# Patient Record
Sex: Male | Born: 1990 | Race: White | Hispanic: No | Marital: Married | Smoking: Current every day smoker
Health system: Southern US, Community
[De-identification: ages and names within clinical notes are randomized; demographics above are authoritative.]

## PROBLEM LIST (undated history)

## (undated) DIAGNOSIS — F32A Depression, unspecified: Secondary | ICD-10-CM

## (undated) DIAGNOSIS — F329 Major depressive disorder, single episode, unspecified: Secondary | ICD-10-CM

## (undated) HISTORY — PX: TONSILLECTOMY: SUR1361

## (undated) HISTORY — PX: ANKLE RECONSTRUCTION: SHX1151

---

## 2015-04-09 ENCOUNTER — Emergency Department
Admission: EM | Admit: 2015-04-09 | Discharge: 2015-04-09 | Disposition: A | Discharge status: Home / Self Care | Payer: Self-pay

## 2015-05-25 ENCOUNTER — Encounter: Payer: Self-pay | Admitting: Emergency Medicine

## 2015-05-25 ENCOUNTER — Emergency Department

## 2015-05-25 ENCOUNTER — Emergency Department
Admission: EM | Admit: 2015-05-25 | Discharge: 2015-05-25 | Disposition: A | Discharge status: Home / Self Care | Attending: Emergency Medicine | Admitting: Emergency Medicine

## 2015-05-25 DIAGNOSIS — S43004A Unspecified dislocation of right shoulder joint, initial encounter: Secondary | ICD-10-CM

## 2015-05-25 DIAGNOSIS — S4991XA Unspecified injury of right shoulder and upper arm, initial encounter: Secondary | ICD-10-CM | POA: Diagnosis present

## 2015-05-25 DIAGNOSIS — Y9389 Activity, other specified: Secondary | ICD-10-CM | POA: Insufficient documentation

## 2015-05-25 DIAGNOSIS — X58XXXA Exposure to other specified factors, initial encounter: Secondary | ICD-10-CM | POA: Insufficient documentation

## 2015-05-25 DIAGNOSIS — Y998 Other external cause status: Secondary | ICD-10-CM | POA: Insufficient documentation

## 2015-05-25 DIAGNOSIS — M24412 Recurrent dislocation, left shoulder: Secondary | ICD-10-CM | POA: Insufficient documentation

## 2015-05-25 DIAGNOSIS — Z880 Allergy status to penicillin: Secondary | ICD-10-CM | POA: Diagnosis not present

## 2015-05-25 DIAGNOSIS — S43014A Anterior dislocation of right humerus, initial encounter: Secondary | ICD-10-CM | POA: Insufficient documentation

## 2015-05-25 DIAGNOSIS — Y9289 Other specified places as the place of occurrence of the external cause: Secondary | ICD-10-CM | POA: Insufficient documentation

## 2015-05-25 DIAGNOSIS — M21821 Other specified acquired deformities of right upper arm: Secondary | ICD-10-CM

## 2015-05-25 HISTORY — DX: Major depressive disorder, single episode, unspecified: F32.9

## 2015-05-25 HISTORY — DX: Depression, unspecified: F32.A

## 2015-05-25 MED ORDER — KETAMINE HCL 50 MG/ML IJ SOLN
INTRAMUSCULAR | Status: AC
  Administered 2015-05-25: 150 mg
  Filled 2015-05-25: qty 10

## 2015-05-25 MED ORDER — KETAMINE HCL 10 MG/ML IJ SOLN
INTRAMUSCULAR | Status: AC | PRN
  Administered 2015-05-25: 150 mg via INTRAVENOUS

## 2015-05-25 MED ORDER — ORPHENADRINE CITRATE 30 MG/ML IJ SOLN
60.0000 mg | Freq: Two times a day (BID) | INTRAMUSCULAR | Status: DC
Start: 2015-05-25 — End: 2015-05-25
  Administered 2015-05-25: 60 mg via INTRAMUSCULAR
  Filled 2015-05-25 (×3): qty 2

## 2015-05-25 MED ORDER — SODIUM CHLORIDE 0.9 % IV SOLN
INTRAVENOUS | Status: AC | PRN
  Administered 2015-05-25: 1000 mL via INTRAVENOUS

## 2015-05-25 MED ORDER — KETOROLAC TROMETHAMINE 60 MG/2ML IM SOLN
60.0000 mg | Freq: Once | INTRAMUSCULAR | Status: AC
  Administered 2015-05-25: 60 mg via INTRAMUSCULAR
  Filled 2015-05-25: qty 2

## 2015-05-25 MED ORDER — KETOROLAC TROMETHAMINE 60 MG/2ML IM SOLN
INTRAMUSCULAR | Status: AC
  Administered 2015-05-25: 60 mg via INTRAMUSCULAR
  Filled 2015-05-25: qty 2

## 2015-05-25 MED ORDER — ORPHENADRINE CITRATE 30 MG/ML IJ SOLN
INTRAMUSCULAR | Status: AC
  Administered 2015-05-25: 60 mg via INTRAMUSCULAR
  Filled 2015-05-25: qty 2

## 2015-05-25 MED ORDER — KETAMINE HCL 10 MG/ML IJ SOLN: 1.0000 mg/kg | Freq: Once | INTRAMUSCULAR | Status: DC

## 2015-05-25 NOTE — Sedation Documentation (Signed)
Shoulder immobilizer applied .

## 2015-05-25 NOTE — ED Notes (Signed)
X-ray at bedside

## 2015-05-25 NOTE — ED Provider Notes (Addendum)
[facility] Emergency Department Provider Note  ____________________________________________  Time seen: Approximately 845 AM  I have reviewed the triage vital signs and the nursing notes.   HISTORY  Chief Complaint Shoulder Pain    HPI Roger Ponce is a 24 y.o. male with a history of multiple right shoulder dislocations who is presenting today with right shoulder pain. He said that he woke up this morning at about 6:15 with right shoulder pain. He says it feels similar to what it has been dislocated in the past. He says that he did not have any trauma to dislocate the shoulder. Did not move his shoulder in an awkward way dislocated. He denies any loss of sensation to the right upper extremity. Is able to range his right hand. Says he has needed to be sedated in the past to have the shoulder reduced. He does not have an orthopedic doctor. He is waiting for his [employer] insurance to become active in order for him to have surgery on the right shoulder. His last time that he had a dislocated was this past March. He last ate at 9 PM last night. Patient is right-hand dominant. Request that his wife was at the bedside signed his consent for procedural sedation and reduction.   Past Medical History  Diagnosis Date  . Depression     There are no active problems to display for this patient.   History reviewed. No pertinent past surgical history.  Current Outpatient Rx  Name  Route  Sig  Dispense  Refill  . hydrOXYzine (ATARAX/VISTARIL) 50 MG tablet   Oral   Take 50 mg by mouth 3 (three) times daily as needed.           Allergies Morphine and related; Amoxicillin; and Penicillins  No family history on file.  Social History Social History  Substance Use Topics  . Smoking status: Never Smoker   . Smokeless tobacco: None  . Alcohol Use: No    Review of Systems Constitutional: No fever/chills Eyes: No visual changes. ENT: No sore  throat. Cardiovascular: Denies chest pain. Respiratory: Denies shortness of breath. Gastrointestinal: No abdominal pain.  No nausea, no vomiting.  No diarrhea.  No constipation. Genitourinary: Negative for dysuria. Musculoskeletal: Negative for back pain. Skin: Negative for rash. Neurological: Negative for headaches, focal weakness or numbness.  10-point ROS otherwise negative.  ____________________________________________   PHYSICAL EXAM:  VITAL SIGNS: ED Triage Vitals  Enc Vitals Group     BP 05/25/15 0716 153/75 mmHg     Pulse Rate 05/25/15 0716 58     Resp 05/25/15 0716 20     Temp 05/25/15 0716 97.7 F (36.5 C)     Temp Source 05/25/15 0716 Oral     SpO2 05/25/15 0716 97 %     Weight 05/25/15 0716 225 lb (102.059 kg)     Height 05/25/15 0716  (1.88 m)     Head Cir --      Peak Flow --      Pain Score --      Pain Loc --      Pain Edu? --      Excl. in GC? --     Constitutional: Alert and oriented. Well appearing and in no acute distress. Eyes: Conjunctivae are normal. PERRL. EOMI. Head: Atraumatic. Nose: No congestion/rhinnorhea. Mouth/Throat: Mucous membranes are moist.  Oropharynx non-erythematous. Mallampati of 1.   Neck: No stridor.   Cardiovascular: Normal rate, regular rhythm. Grossly normal heart sounds.  Good peripheral circulation. Respiratory: Normal respiratory effort.  No retractions. Lungs CTAB. Gastrointestinal: Soft and nontender. No distention. No abdominal bruits. No CVA tenderness. Musculoskeletal: No lower extremity tenderness nor edema.  Right shoulder with anterior and lateral tenderness palpation. Humerus is displaced anteriorly on exam. There is sensation over the deltoid. Distally neurovascularly intact. 5 out of 5 strength to the right hand.  Neurologic:  Normal speech and language. No gross focal neurologic deficits are appreciated. No gait instability. Skin:  Skin is warm, dry and intact. No rash noted. Psychiatric: Mood and affect  are normal. Speech and behavior are normal.  ____________________________________________   LABS (all labs ordered are listed, but only abnormal results are displayed)  Labs Reviewed - No data to display ____________________________________________  EKG   ____________________________________________  RADIOLOGY  Initial x-ray showing anterior dislocation with Hill-Sachs deformity. Postreduction film showing successful reduction with inability to rule out Hill-Sachs deformity. I personally reviewed both these films. ____________________________________________   PROCEDURES  Reduction of dislocation Date/Time: 9AM Performed by: Arelia Longest Authorized by: Arelia Longest Consent: Verbal and written consent obtained. Risks and benefits: risks, benefits and alternatives were discussed Consent given by: patient Required items: required blood products, implants, devices, and special equipment available Time out: Immediately prior to procedure a "time out" was called to verify the correct patient, procedure, equipment, support staff and site/side marked as required.  Patient sedated: Sedated with ketamine at 1.5 mg/kg.  End tidal CO2 monitored. Patient tolerated the procedure well with the sedation.  Vitals: Vital signs were monitored during sedation. Patient tolerance: Patient tolerated the procedure well with no immediate complications. Joint: Right shoulder Reduction technique: Modified [place].   Reduction successful and placed in right shoulder immobilizer status post reduction.    ____________________________________________   INITIAL IMPRESSION / ASSESSMENT AND PLAN / ED COURSE  Pertinent labs & imaging results that were available during my care of the patient were reviewed by me and considered in my medical decision making (see chart for details).  ----------------------------------------- 10:12 AM on  05/25/2015 -----------------------------------------  Patient is now back to baseline. 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 Will follow-up with on-call orthopedics. Comfortable in shoulder immobilizer and still neurovascularly intact with sensation over the right deltoid. ____________________________________________   FINAL CLINICAL IMPRESSION(S) / ED DIAGNOSES  Acute right shoulder dislocation with reduction. Possible Hill-Sachs deformity. Procedural sedation. Initial visit.     Myrna Blazer, MD 05/25/15 1014  Patient knows that he must wear the shoulder immobilizer until he is followed up by orthopedics.  Myrna Blazer, MD 05/25/15 [phone]

## 2015-05-25 NOTE — ED Notes (Signed)
States he has a hx of shoulder dislocation ..woke up this am with right shoulder pain  Positive deformity

## 2015-05-25 NOTE — ED Notes (Signed)
Pt able to state name, location, situation, and time, wife at bedsdie

## 2015-05-25 NOTE — ED Notes (Signed)
Pt states he woke up this AM with right shoulder pain and dislocation, pt states this has happened 3 times before, pt states he is to get surgery at the [facility] whenever his benfits kick in, swelling noted to the right shoulder area

## 2015-05-25 NOTE — ED Notes (Signed)
Pt moved to room 25 via stretcher . Report called to [facility]

## 2015-05-25 NOTE — Sedation Documentation (Signed)
Medication dose calculated and verified for by [facility]

## 2015-05-25 NOTE — Discharge Instructions (Signed)
Shoulder Dislocation ° Shoulder dislocation is when your upper arm bone (humerus) is forced out of your shoulder joint. Your doctor will put your shoulder back into the joint by pulling on your arm or through surgery. Your arm will be placed in a shoulder immobilizer or sling. The shoulder immobilizer or sling holds your shoulder in place while it heals. °HOME CARE  °· Rest your injured joint. Do not move it until instructed to do so. °· Put ice on your injured joint as told by your doctor. °¨ Put ice in a plastic bag. °¨ Place a towel between your skin and the bag. °¨ Leave the ice on for 15-20 minutes at a time, every 2 hours while you are awake. °· Only take medicines as told by your doctor. °· Squeeze a ball to exercise your hand. °GET HELP RIGHT AWAY IF:  °· Your splint or sling becomes damaged. °· Your pain becomes worse, not better. °· You lose feeling in your arm or hand. °· Your arm or hand becomes white or cold. °MAKE SURE YOU:  °· Understand these instructions. °· Will watch your condition. °· Will get help right away if you are not doing well or get worse. °Document Released: 11/27/2011 Document Reviewed: 11/27/2011 °Exit[employer]® Patient Information ©2015 [employer]. This information is not intended to replace advice given to you by your health care provider. Make sure you discuss any questions you have with your health care provider. ° °

## 2016-01-19 ENCOUNTER — Emergency Department: Payer: BLUE CROSS/BLUE SHIELD

## 2016-01-19 ENCOUNTER — Emergency Department
Admission: EM | Admit: 2016-01-19 | Discharge: 2016-01-19 | Disposition: A | Discharge status: Home / Self Care | Payer: BLUE CROSS/BLUE SHIELD | Attending: Emergency Medicine | Admitting: Emergency Medicine

## 2016-01-19 ENCOUNTER — Encounter: Payer: Self-pay | Admitting: Emergency Medicine

## 2016-01-19 DIAGNOSIS — Y9384 Activity, sleeping: Secondary | ICD-10-CM | POA: Insufficient documentation

## 2016-01-19 DIAGNOSIS — S4991XA Unspecified injury of right shoulder and upper arm, initial encounter: Secondary | ICD-10-CM | POA: Diagnosis present

## 2016-01-19 DIAGNOSIS — Y999 Unspecified external cause status: Secondary | ICD-10-CM | POA: Insufficient documentation

## 2016-01-19 DIAGNOSIS — Y929 Unspecified place or not applicable: Secondary | ICD-10-CM | POA: Diagnosis not present

## 2016-01-19 DIAGNOSIS — S43004A Unspecified dislocation of right shoulder joint, initial encounter: Secondary | ICD-10-CM | POA: Insufficient documentation

## 2016-01-19 DIAGNOSIS — F329 Major depressive disorder, single episode, unspecified: Secondary | ICD-10-CM | POA: Diagnosis not present

## 2016-01-19 DIAGNOSIS — X509XXA Other and unspecified overexertion or strenuous movements or postures, initial encounter: Secondary | ICD-10-CM | POA: Insufficient documentation

## 2016-01-19 DIAGNOSIS — Z880 Allergy status to penicillin: Secondary | ICD-10-CM | POA: Insufficient documentation

## 2016-01-19 MED ORDER — HYDROMORPHONE HCL 1 MG/ML IJ SOLN
1.0000 mg | Freq: Once | INTRAMUSCULAR | Status: AC
  Administered 2016-01-19: 1 mg via INTRAVENOUS
  Filled 2016-01-19: qty 1

## 2016-01-19 MED ORDER — ETOMIDATE 2 MG/ML IV SOLN
10.0000 mg | Freq: Once | INTRAVENOUS | Status: DC
  Filled 2016-01-19: qty 10

## 2016-01-19 MED ORDER — ETOMIDATE 2 MG/ML IV SOLN
INTRAVENOUS | Status: AC | PRN
  Administered 2016-01-19 (×2): 5 mg via INTRAVENOUS
  Administered 2016-01-19: 10 mg via INTRAVENOUS

## 2016-01-19 MED ORDER — SODIUM CHLORIDE 0.9 % IV SOLN
INTRAVENOUS | Status: AC | PRN
  Administered 2016-01-19: 1000 mL via INTRAVENOUS

## 2016-01-19 MED ORDER — OXYCODONE-ACETAMINOPHEN 5-325 MG PO TABS: 1.0000 | ORAL_TABLET | Freq: Four times a day (QID) | ORAL | Status: DC | PRN

## 2016-01-19 NOTE — Discharge Instructions (Signed)
Shoulder Dislocation °A shoulder dislocation happens when the upper arm bone (humerus) moves out of the shoulder joint. The shoulder joint is the part of the shoulder where the humerus, shoulder blade (scapula), and collarbone (clavicle) meet. °CAUSES °This condition is often caused by: °· A fall. °· A hit to the shoulder. °· A forceful movement of the shoulder. °RISK FACTORS °This condition is more likely to develop in people who play sports. °SYMPTOMS °Symptoms of this condition include: °· Deformity of the shoulder. °· Intense pain. °· Inability to move the shoulder. °· Numbness, weakness, or tingling in your neck or down your arm. °· Bruising or swelling around your shoulder. °DIAGNOSIS °This condition is diagnosed with a physical exam. After the exam, tests may be done to check for related problems. Tests that may be done include: °· X-ray. This may be done to check for broken bones. °· MRI. This may be done to check for damage to the tissues around the shoulder. °· Electromyogram. This may be done to check for nerve damage. °TREATMENT °This condition is treated with a procedure to place the humerus back in the joint. This procedure is called a reduction. There are two types of reduction: °· Closed reduction. In this procedure, the humerus is placed back in the joint without surgery. The health care provider uses his or her hands to guide the bone back into place. °· Open reduction. In this procedure, the humerus is placed back in the joint with surgery. An open reduction may be recommended if: °¨ You have a weak shoulder joint or weak ligaments. °¨ You have had more than one shoulder dislocation. °¨ The nerves or blood vessels around your shoulder have been damaged. °After the humerus is placed back into the joint, your arm will be placed in a splint or sling to prevent it from moving. You will need to wear the splint or sling until your shoulder heals. When the splint or sling is removed, you may have  physical therapy to help improve the range of motion in your shoulder joint. °HOME CARE INSTRUCTIONS °If You Have a Splint or Sling: °· Wear it as told by your health care provider. Remove it only as told by your health care provider. °· Loosen it if your fingers become numb and tingle, or if they turn cold and blue. °· Keep it clean and dry. °Bathing °· Do not take baths, swim, or use a hot tub until your health care provider approves. Ask your health care provider if you can take showers. You may only be allowed to take sponge baths for bathing. °· If your health care provider approves bathing and showering, cover your splint or sling with a watertight plastic bag to protect it from water. Do not let the splint or sling get wet. °Managing Pain, Stiffness, and Swelling °· If directed, apply ice to the injured area. °¨ Put ice in a plastic bag. °¨ Place a towel between your skin and the bag. °¨ Leave the ice on for 20 minutes, 2-3 times per day. °· Move your fingers often to avoid stiffness and to decrease swelling. °· Raise (elevate) the injured area above the level of your heart while you are sitting or lying down. °Driving °· Do not drive while wearing a splint or sling on a hand that you use for driving. °· Do not drive or operate heavy machinery while taking pain medicine. °Activity °· Return to your normal activities as told by your health care provider. Ask your   health care provider what activities are safe for you. °· Perform range-of-motion exercises only as told by your health care provider. °· Exercise your hand by squeezing a soft ball. This helps to decrease stiffness and swelling in your hand and wrist. °General Instructions °· Take over-the-counter and prescription medicines only as told by your health care provider. °· Do not use any tobacco products, including cigarettes, chewing tobacco, or e-cigarettes. Tobacco can delay bone and tissue healing. If you need help quitting, ask your health care  provider. °· Keep all follow-up visits as told by your health care provider. This is important. °SEEK MEDICAL CARE IF: °· Your splint or sling gets damaged. °SEEK IMMEDIATE MEDICAL CARE IF: °· Your pain gets worse rather than better. °· You lose feeling in your arm or hand. °· Your arm or hand becomes white and cold. °  °This information is not intended to replace advice given to you by your health care provider. Make sure you discuss any questions you have with your health care provider. °  °Document Released: 05/30/2001 Document Revised: 05/26/2015 Document Reviewed: 12/28/2014 °Elsevier Interactive Patient Education ©2016 [employer] ° °

## 2016-01-19 NOTE — ED Notes (Signed)
Pt presents to ED via EMS with dislocated right shoulder. Pt states he dislocated it rolling while sleeping. Deformity noted at right shoulder. HX of dislocating shoulders.

## 2016-01-19 NOTE — Sedation Documentation (Signed)
Reduction completed by Dr. Zenda AlpersWebster.

## 2016-01-19 NOTE — ED Provider Notes (Signed)
[facility] Emergency Department Provider Note   ____________________________________________  Time seen: Approximately 4:03 AM  I have reviewed the triage vital signs and the nursing notes.   HISTORY  Chief Complaint Dislocation    HPI Roger Ponce is a 25 y.o. male who comes into the hospital today with a shoulder dislocation. The patient reports that he was sleeping and dislocated shoulder. He reports that he is on it before on the right arm. He is dislocated his shoulder 4 times this year. He rates his pain 8 out of 10 in intensity. He reports that he always has been put out to get his shoulder back in. He has no numbness or tingling at this time.   Past Medical History  Diagnosis Date  . Depression     There are no active problems to display for this patient.   Past Surgical History  Procedure Laterality Date  . Ankle reconstruction    . Tonsillectomy      Current Outpatient Rx  Name  Route  Sig  Dispense  Refill  . hydrOXYzine (ATARAX/VISTARIL) 50 MG tablet   Oral   Take 50 mg by mouth 3 (three) times daily as needed.         Marland Kitchen oxyCODONE-acetaminophen (ROXICET) 5-325 MG tablet   Oral   Take 1 tablet by mouth every 6 (six) hours as needed.   12 tablet   0     Allergies Morphine and related; Amoxicillin; and Penicillins  History reviewed. No pertinent family history.  Social History Social History  Substance Use Topics  . Smoking status: Never Smoker   . Smokeless tobacco: None  . Alcohol Use: No    Review of Systems Constitutional: No fever/chills Eyes: No visual changes. ENT: No sore throat. Cardiovascular: Denies chest pain. Respiratory: Denies shortness of breath. Gastrointestinal: No abdominal pain.  No nausea, no vomiting.  No diarrhea.  No constipation. Genitourinary: Negative for dysuria. Musculoskeletal: right shoulder pain and dislocation Skin: Negative for rash. Neurological: Negative for  headaches, focal weakness or numbness.  10-point ROS otherwise negative.  ____________________________________________   PHYSICAL EXAM:  VITAL SIGNS: ED Triage Vitals  Enc Vitals Group     BP 01/19/16 0356 132/76 mmHg     Pulse Rate 01/19/16 0356 61     Resp 01/19/16 0356 16     Temp 01/19/16 0356 98.1 F (36.7 C)     Temp Source 01/19/16 0356 Oral     SpO2 01/19/16 0356 97 %     Weight --      Height --      Head Cir --      Peak Flow --      Pain Score 01/19/16 0356 8     Pain Loc --      Pain Edu? --      Excl. in GC? --     Constitutional: Alert and oriented. Well appearing and in moderate distress. Eyes: Conjunctivae are normal. PERRL. EOMI. Head: Atraumatic. Nose: No congestion/rhinnorhea. Mouth/Throat: Mucous membranes are moist.  Oropharynx non-erythematous. Cardiovascular: Normal rate, regular rhythm. Grossly normal heart sounds.  Good peripheral circulation. Respiratory: Normal respiratory effort.  No retractions. Lungs CTAB. Gastrointestinal: Soft and nontender. No distention. Positive bowel sounds Musculoskeletal: right shoulder deformity with pain and inability to move   Neurologic:  Normal speech and language.  Skin:  Skin is warm, dry and intact.  Psychiatric: Mood and affect are normal.   ____________________________________________   LABS (all labs ordered are  listed, but only abnormal results are displayed)  Labs Reviewed - No data to display ____________________________________________  EKG  none ____________________________________________  RADIOLOGY  Right shoulder Xray: Anterior dislocation of the right shoulder.  Right shoulder xray: Interval relocation of the right shoulder ____________________________________________   PROCEDURES  Procedure(s) performed: please, see procedure note(s).   Reduction of dislocation Date/Time: 5:07 AM Performed by: Lucrezia EuropeWebster,  Allison P Authorized by: Lucrezia EuropeWebster,  Allison P Consent: Verbal consent  obtained. Risks and benefits: risks, benefits and alternatives were discussed Consent given by: patient Required items: required blood products, implants, devices, and special equipment available Time out: Immediately prior to procedure a "time out" was called to verify the correct patient, procedure, equipment, support staff and site/side marked as required.  Patient sedated: with etomidate  Vitals: Vital signs were monitored during sedation. Patient tolerance: Patient tolerated the procedure well with no immediate complications. Joint: right shoulder Reduction technique: traction and rotation   Critical Care performed: No  ____________________________________________   INITIAL IMPRESSION / ASSESSMENT AND PLAN / ED COURSE  Pertinent labs & imaging results that were available during my care of the patient were reviewed by me and considered in my medical decision making (see chart for details).  Is a 25 year old male who comes into the hospital today with a shoulder dislocation. The patient was sedated with etomidate and his shoulder was reduced. He did receive a dose of Dilaudid for pain as well. He was placed in an immobilizer and he'll be discharged home to follow-up with orthopedic surgery. ____________________________________________   FINAL CLINICAL IMPRESSION(S) / ED DIAGNOSES  Final diagnoses:  Shoulder dislocation, right, initial encounter      NEW MEDICATIONS STARTED DURING THIS VISIT:  Discharge Medication List as of 01/19/2016  6:08 AM    START taking these medications   Details  oxyCODONE-acetaminophen (ROXICET) 5-325 MG tablet Take 1 tablet by mouth every 6 (six) hours as needed., Starting 01/19/2016, Until Discontinued, Print         Note:  This document was prepared using Dragon voice recognition software and may include unintentional dictation errors.    Rebecka ApleyAllison P Webster, MD 01/19/16 [phone]

## 2016-01-19 NOTE — ED Notes (Signed)
Blood collected and send

## 2016-01-19 NOTE — ED Notes (Signed)
Patient transported to X-ray 

## 2016-12-13 ENCOUNTER — Emergency Department
Admission: EM | Admit: 2016-12-13 | Discharge: 2016-12-13 | Disposition: A | Discharge status: Home / Self Care | Payer: BLUE CROSS/BLUE SHIELD | Attending: Emergency Medicine | Admitting: Emergency Medicine

## 2016-12-13 ENCOUNTER — Emergency Department: Payer: BLUE CROSS/BLUE SHIELD

## 2016-12-13 DIAGNOSIS — Y939 Activity, unspecified: Secondary | ICD-10-CM | POA: Insufficient documentation

## 2016-12-13 DIAGNOSIS — Y929 Unspecified place or not applicable: Secondary | ICD-10-CM | POA: Insufficient documentation

## 2016-12-13 DIAGNOSIS — S43004A Unspecified dislocation of right shoulder joint, initial encounter: Secondary | ICD-10-CM

## 2016-12-13 DIAGNOSIS — X58XXXA Exposure to other specified factors, initial encounter: Secondary | ICD-10-CM | POA: Insufficient documentation

## 2016-12-13 DIAGNOSIS — Y999 Unspecified external cause status: Secondary | ICD-10-CM | POA: Insufficient documentation

## 2016-12-13 DIAGNOSIS — S43014A Anterior dislocation of right humerus, initial encounter: Secondary | ICD-10-CM | POA: Insufficient documentation

## 2016-12-13 MED ORDER — KETOROLAC TROMETHAMINE 30 MG/ML IJ SOLN
30.0000 mg | Freq: Once | INTRAMUSCULAR | Status: AC
  Administered 2016-12-13: 30 mg via INTRAVENOUS
  Filled 2016-12-13: qty 1

## 2016-12-13 MED ORDER — PROPOFOL 10 MG/ML IV BOLUS
INTRAVENOUS | Status: AC | PRN
  Administered 2016-12-13: 102.3 mg via INTRAVENOUS

## 2016-12-13 MED ORDER — SODIUM CHLORIDE 0.9 % IV SOLN
INTRAVENOUS | Status: AC | PRN
  Administered 2016-12-13: 1000 mL via INTRAVENOUS

## 2016-12-13 MED ORDER — PROPOFOL 1000 MG/100ML IV EMUL
5.0000 ug/kg/min | Freq: Once | INTRAVENOUS | Status: DC
Start: 2016-12-13 — End: 2016-12-13
  Filled 2016-12-13: qty 100

## 2016-12-13 MED ORDER — PROPOFOL 10 MG/ML IV BOLUS
1.0000 mg/kg | Freq: Once | INTRAVENOUS | Status: AC
  Administered 2016-12-13: 102.3 mg via INTRAVENOUS

## 2016-12-13 NOTE — Sedation Documentation (Signed)
EDP relocated R shoulder at this time

## 2016-12-13 NOTE — Discharge Instructions (Signed)
Please return immediately if condition worsens. Please contact her primary physician or the physician you were given for referral. If you have any specialist physicians involved in her treatment and plan please also contact them. Thank you for using [facility].  Over-the-counter ibuprofen for pain. He can also take Tylenol for discomfort. Rest and ice the shoulder for the next 2 days

## 2016-12-13 NOTE — Sedation Documentation (Signed)
Family updated as to patient's status.

## 2016-12-13 NOTE — ED Triage Notes (Signed)
Pt rolled in bed and dislocated right shoulder, hx of the same. Deformity noted at this time.

## 2016-12-13 NOTE — ED Provider Notes (Signed)
Time Seen: Approximately 0408 I have reviewed the triage notes  Chief Complaint: Dislocation   History of Present Illness: Roger Ponce is a 26 y.o. male * who presents with a history of multiple shoulder dislocations. Patient states that he must of rolled over somehow on his sleep and dislocated his right shoulder. He states he has been to the [facility] before with discussion for surgical repair. He has acute pain in the right shoulder with no other complaints.   Past Medical History:  Diagnosis Date  . Depression     There are no active problems to display for this patient.   Past Surgical History:  Procedure Laterality Date  . ANKLE RECONSTRUCTION    . TONSILLECTOMY      Past Surgical History:  Procedure Laterality Date  . ANKLE RECONSTRUCTION    . TONSILLECTOMY      Current Outpatient Rx  . Order #: [id number] Class: Historical Med  . Order #: [id number] Class: Print    Allergies:  Morphine and related; Amoxicillin; Penicillins; and Sulfa antibiotics  Family History: No family history on file.  Social History: Social History  Substance Use Topics  . Smoking status: Never Smoker  . Smokeless tobacco: Not on file  . Alcohol use No     Review of Systems:   10 point review of systems was performed and was otherwise negative:  Constitutional: No fever Eyes: No visual disturbances ENT: No sore throat, ear pain Cardiac: No chest pain Respiratory: No shortness of breath, wheezing, or stridor Abdomen: No abdominal pain, no vomiting, No diarrhea Endocrine: No weight loss, No night sweats Extremities: No peripheral edema, cyanosis Skin: No rashes, easy bruising Neurologic: No focal weakness, trouble with speech or swollowing Urologic: No dysuria, Hematuria, or urinary frequency   Physical Exam:  ED Triage Vitals  Enc Vitals Group     BP 12/13/16 0405 (!) 129/47     Pulse Rate 12/13/16 0405 60     Resp 12/13/16 0405 18     Temp 12/13/16 0405  97.5 F (36.4 C)     Temp Source 12/13/16 0405 Oral     SpO2 12/13/16 0405 100 %     Weight 12/13/16 0403 230 lb (104.3 kg)     Height 12/13/16 0403 6\' 2"  (1.88 m)     Head Circumference --      Peak Flow --      Pain Score 12/13/16 0403 9     Pain Loc --      Pain Edu? --      Excl. in GC? --     General: Awake , Alert , and Oriented times 3; GCS 15 Head: Normal cephalic , atraumatic Eyes: Pupils equal , round, reactive to light Nose/Throat: No nasal drainage, patent upper airway without erythema or exudate.  Neck: Supple, Full range of motion, No anterior adenopathy or palpable thyroid masses Lungs: Clear to ascultation without wheezes , rhonchi, or rales Heart: Regular rate, regular rhythm without murmurs , gallops , or rubs Abdomen: Soft, non tender without rebound, guarding , or rigidity; bowel sounds positive and symmetric in all 4 quadrants. No organomegaly .        Extremities: Right shoulder shows tenderness and some mild crepitus otherwise the right upper extremity is neurovascularly intact  Neurologic: normal ambulation, Motor symmetric without deficits, sensory intact Skin: warm, dry, no rashes     Radiology:  "Dg Shoulder Right  Result Date: 12/13/2016 CLINICAL DATA:  Right shoulder dislocation, postreduction. EXAM: RIGHT  SHOULDER - 2+ VIEW COMPARISON:  Pre reduction views earlier this day. FINDINGS: Normal glenohumeral alignment postreduction. Moderate Hill-Sachs impaction injury to the lateral humeral head. Mild persistent acromioclavicular joint space widening. IMPRESSION: Reduction of previous shoulder dislocation. Moderate Hill-Sachs impaction injury to the humeral head. Electronically Signed   By: Rubye OaksMelanie  Ehinger M.D.   On: 12/13/2016 05:37   Dg Shoulder Right  Result Date: 12/13/2016 CLINICAL DATA:  26 y/o M; right shoulder pain with history of dislocation. EXAM: RIGHT SHOULDER - 2+ VIEW COMPARISON:  01/19/2016 right shoulder radiographs. FINDINGS: Right  anterior shoulder dislocation. No acute fracture is identified. Widened acromioclavicular interval measuring 9 mm. Normal coracoclavicular distance. IMPRESSION: Right anterior shoulder dislocation. No acute fracture identified. Stable minimal widening of the acromioclavicular interval may represent chronic joint injury. Electronically Signed   By: Mitzi HansenLance  Furusawa-Stratton M.D.   On: 12/13/2016 04:38  " I personally reviewed the radiologic studies   Procedures: #1  conscious sedation Patient had IV access and consent was obtained for using conscious sedation medications. Propofol indications were reviewed with the patient. Patient tolerated propofol while was continually monitored here in emergency department without any episodes of hypoxia or any other concerns. Propofol was injected per the nursing staff   #2 Right shoulder reduction After timeout was performed the patient had a reduction of his right shoulder with simple traction and lateral motion. Patient tolerated procedure well and he had a shoulder immobilizer established by the nursing staff Right upper extremity is neurovascularly intact  ED Course: * Patient's stay here was uneventful and when it is appropriate patient will be discharged and was referred to orthopedics     Assessment:  Right shoulder anterior dislocation Reduction of right shoulder Conscious sedation   Final Clinical Impression:   Final diagnoses:  Shoulder dislocation, right, initial encounter     Plan:  Outpatient Patient was advised to return immediately if condition worsens. Patient was advised to follow up with their primary care physician or other specialized physicians involved in their outpatient care. The patient and/or family member/power of attorney had laboratory results reviewed at the bedside. All questions and concerns were addressed and appropriate discharge instructions were distributed by the nursing staff.            Jennye MoccasinBrian  S Tori Dattilio, MD 12/13/16 [phone]

## 2016-12-13 NOTE — Sedation Documentation (Signed)
Pt able to breathe freely on his own, wakes up intermittently.  Family at bedside.

## 2017-01-11 ENCOUNTER — Emergency Department
Admission: EM | Admit: 2017-01-11 | Discharge: 2017-01-11 | Disposition: A | Discharge status: Home / Self Care | Attending: Emergency Medicine | Admitting: Emergency Medicine

## 2017-01-11 ENCOUNTER — Encounter: Payer: Self-pay | Admitting: Emergency Medicine

## 2017-01-11 ENCOUNTER — Emergency Department

## 2017-01-11 DIAGNOSIS — S43014A Anterior dislocation of right humerus, initial encounter: Secondary | ICD-10-CM | POA: Insufficient documentation

## 2017-01-11 DIAGNOSIS — Y999 Unspecified external cause status: Secondary | ICD-10-CM | POA: Insufficient documentation

## 2017-01-11 DIAGNOSIS — S43004A Unspecified dislocation of right shoulder joint, initial encounter: Secondary | ICD-10-CM

## 2017-01-11 DIAGNOSIS — Y939 Activity, unspecified: Secondary | ICD-10-CM | POA: Insufficient documentation

## 2017-01-11 DIAGNOSIS — X501XXA Overexertion from prolonged static or awkward postures, initial encounter: Secondary | ICD-10-CM | POA: Insufficient documentation

## 2017-01-11 DIAGNOSIS — Y929 Unspecified place or not applicable: Secondary | ICD-10-CM | POA: Insufficient documentation

## 2017-01-11 MED ORDER — LIDOCAINE HCL (PF) 1 % IJ SOLN
INTRAMUSCULAR | Status: AC
  Administered 2017-01-11: 10 mL via INTRADERMAL
  Filled 2017-01-11: qty 10

## 2017-01-11 MED ORDER — ETOMIDATE 2 MG/ML IV SOLN
0.3000 mg/kg | Freq: Once | INTRAVENOUS | Status: AC
  Administered 2017-01-11: 30.64 mg via INTRAVENOUS

## 2017-01-11 MED ORDER — ETOMIDATE 2 MG/ML IV SOLN
INTRAVENOUS | Status: AC
  Filled 2017-01-11: qty 10

## 2017-01-11 MED ORDER — LIDOCAINE HCL (PF) 1 % IJ SOLN
10.0000 mL | Freq: Once | INTRAMUSCULAR | Status: AC
  Administered 2017-01-11: 10 mL via INTRADERMAL

## 2017-01-11 MED ORDER — OXYCODONE-ACETAMINOPHEN 5-325 MG PO TABS: 1.0000 | ORAL_TABLET | Freq: Four times a day (QID) | ORAL | 0 refills | Status: DC | PRN

## 2017-01-11 MED ORDER — ETOMIDATE 2 MG/ML IV SOLN
INTRAVENOUS | Status: AC
  Administered 2017-01-11: 30.64 mg via INTRAVENOUS
  Filled 2017-01-11: qty 10

## 2017-01-11 NOTE — ED Provider Notes (Signed)
[facility] Emergency Department Provider Note   ____________________________________________   First MD Initiated Contact with Patient 01/11/17 [phone]     (approximate)  I have reviewed the triage vital signs and the nursing notes.   HISTORY  Chief Complaint Shoulder Pain    HPI Roger Ponce is a 26 y.o. male who comes into the hospital today with a right shoulder dislocation. The patient reports that he thinks he may have rolled over in bed and dislocated his shoulder. The patient was seen one month ago with the exact same story and history. He reports that this has happened multiple times in the past. It hurts pretty bad he states. The patient's pain is rated as a 10 out of 10 in intensity. He is able to move his fingers and has no decrease in sensation. He denies any other trauma at this time. He has no other complaints. He is here today for his shoulder dislocation to be reduced.   Past Medical History:  Diagnosis Date  . Depression     There are no active problems to display for this patient.   Past Surgical History:  Procedure Laterality Date  . ANKLE RECONSTRUCTION    . TONSILLECTOMY      Prior to Admission medications   Medication Sig Start Date End Date Taking? Authorizing Provider  hydrOXYzine (ATARAX/VISTARIL) 50 MG tablet Take 50 mg by mouth 3 (three) times daily as needed.    Historical Provider, MD  oxyCODONE-acetaminophen (ROXICET) 5-325 MG tablet Take 1 tablet by mouth every 6 (six) hours as needed. 01/19/16   Rebecka Apley, MD  oxyCODONE-acetaminophen (ROXICET) 5-325 MG tablet Take 1 tablet by mouth every 6 (six) hours as needed. 01/11/17   Rebecka Apley, MD    Allergies Morphine and related; Amoxicillin; Penicillins; and Sulfa antibiotics  No family history on file.  Social History Social History  Substance Use Topics  . Smoking status: Never Smoker  . Smokeless tobacco: Never Used  . Alcohol use No     Review of Systems  Constitutional: No fever/chills Eyes: No visual changes. ENT: No sore throat. Cardiovascular: Denies chest pain. Respiratory: Denies shortness of breath. Gastrointestinal: No abdominal pain.  No nausea, no vomiting.  No diarrhea.  No constipation. Genitourinary: Negative for dysuria. Musculoskeletal: right shoulder dislocation Skin: Negative for rash. Neurological: Negative for headaches, focal weakness or numbness.   ____________________________________________   PHYSICAL EXAM:  VITAL SIGNS: ED Triage Vitals  Enc Vitals Group     BP 01/11/17 0310 114/76     Pulse --      Resp 01/11/17 0310 18     Temp 01/11/17 0310 97.9 F (36.6 C)     Temp Source 01/11/17 0310 Oral     SpO2 01/11/17 0310 98 %     Weight 01/11/17 0312 225 lb (102.1 kg)     Height 01/11/17 0312  (1.88 m)     Head Circumference --      Peak Flow --      Pain Score 01/11/17 0310 10     Pain Loc --      Pain Edu? --      Excl. in GC? --     Constitutional: Alert and oriented. Well appearing and in moderate distress. Eyes: Conjunctivae are normal. PERRL. EOMI. Head: Atraumatic. Nose: No congestion/rhinnorhea. Mouth/Throat: Mucous membranes are moist.  Oropharynx non-erythematous. Cardiovascular: Normal rate, regular rhythm. Grossly normal heart sounds.  Good peripheral circulation. Respiratory: Normal respiratory effort.  No retractions. Lungs CTAB. Gastrointestinal: Soft and nontender. No distention. Positive bowel sounds Musculoskeletal: right shoulder deformity, sensation intact to fingers and hands. Color and motion intact. Neurologic:  Normal speech and language.  Skin:  Skin is warm, dry and intact. Psychiatric: Mood and affect are normal.  ____________________________________________   LABS (all labs ordered are listed, but only abnormal results are displayed)  Labs Reviewed - No data to  display ____________________________________________  EKG  none ____________________________________________  RADIOLOGY  Right shoulder xray ____________________________________________   PROCEDURES  Procedure(s) performed: please, see procedure note(s).  Reduction of dislocation Date/Time: 01/11/2017 3:55 AM Performed by: Rebecka Apley Authorized by: Rebecka Apley  Consent: Verbal consent obtained. Written consent obtained. Consent given by: patient Patient understanding: patient states understanding of the procedure being performed Patient consent: the patient's understanding of the procedure matches consent given Patient identity confirmed: verbally with patient Time out: Immediately prior to procedure a "time out" was called to verify the correct patient, procedure, equipment, support staff and site/side marked as required. Local anesthesia used: yes Anesthesia: hematoma block  Anesthesia: Local anesthesia used: yes Local Anesthetic: lidocaine 1% without epinephrine  Sedation: Patient sedated: yes Sedation type: moderate (conscious) sedation Sedatives: etomidate Sedation start date/time: 01/11/2017 4:00 AM Sedation end date/time: 01/11/2017 4:05 AM Vitals: Vital signs were monitored during sedation. Patient tolerance: Patient tolerated the procedure well with no immediate complications     Critical Care performed: No  ____________________________________________   INITIAL IMPRESSION / ASSESSMENT AND PLAN / ED COURSE  Pertinent labs & imaging results that were available during my care of the patient were reviewed by me and considered in my medical decision making (see chart for details).  This is a 26 year old male who comes into the hospital today with a right shoulder dislocation. I will perform an x-ray and then I will reduce the patient's dislocation with sedation.  Clinical Course as of Jan 11 602  Thu Jan 11, 2017  0412 Anterior  subcoracoid right humeral head dislocation with impaction the glenoid rim. Hill-Sachs deformity noted. No bony Bankart lesion of the glenoid   DG Shoulder Right [AW]  1610 Interval successful reduction of the previously seen dislocated right shoulder.   DG Shoulder Right Portable [AW]    Clinical Course User Index [AW] Rebecka Apley, MD   I was able to successfully reduce the patient's dislocation. He did take some time to wake up from sedation. He received approximately 9 ML's of etomidate. The patient did recover successfully with no adverse affects. He was placed in a shoulder immobilizer and we will have the patient follow-up with orthopedic surgery. The patient has no further questions or concerns at this time. He will be discharged to home.  ____________________________________________   FINAL CLINICAL IMPRESSION(S) / ED DIAGNOSES  Final diagnoses:  Dislocation of right shoulder joint, initial encounter      NEW MEDICATIONS STARTED DURING THIS VISIT:  Discharge Medication List as of 01/11/2017  5:51 AM    START taking these medications   Details  !! oxyCODONE-acetaminophen (ROXICET) 5-325 MG tablet Take 1 tablet by mouth every 6 (six) hours as needed., Starting Thu 01/11/2017, Print     !! - Potential duplicate medications found. Please discuss with provider.       Note:  This document was prepared using Dragon voice recognition software and may include unintentional dictation errors.    Rebecka Apley, MD 01/11/17 [phone]

## 2017-01-11 NOTE — ED Triage Notes (Signed)
Pt presents to ED with dislocated right shoulder after rolling over in bed.  hx of the same. Last time was approx 1 month ago.

## 2017-01-11 NOTE — Sedation Documentation (Signed)
Right shoulder reduction completed by MD, Zenda Alpers.

## 2017-01-11 NOTE — Sedation Documentation (Signed)
Family updated as to patient's status.

## 2017-01-11 NOTE — Sedation Documentation (Signed)
Shoulder immobilizer in place per ED staff, MD and RN

## 2017-04-23 ENCOUNTER — Emergency Department: Payer: Self-pay

## 2017-04-23 ENCOUNTER — Emergency Department
Admission: EM | Admit: 2017-04-23 | Discharge: 2017-04-23 | Disposition: A | Discharge status: Home / Self Care | Payer: Self-pay | Attending: Emergency Medicine | Admitting: Emergency Medicine

## 2017-04-23 ENCOUNTER — Encounter: Payer: Self-pay | Admitting: Emergency Medicine

## 2017-04-23 DIAGNOSIS — R05 Cough: Secondary | ICD-10-CM | POA: Insufficient documentation

## 2017-04-23 DIAGNOSIS — J189 Pneumonia, unspecified organism: Secondary | ICD-10-CM | POA: Insufficient documentation

## 2017-04-23 MED ORDER — AZITHROMYCIN 500 MG PO TABS
500.0000 mg | ORAL_TABLET | Freq: Once | ORAL | Status: AC
  Administered 2017-04-23: 500 mg via ORAL
  Filled 2017-04-23: qty 1

## 2017-04-23 MED ORDER — AZITHROMYCIN 250 MG PO TABS: ORAL_TABLET | ORAL | 0 refills | Status: AC

## 2017-04-23 MED ORDER — ACETAMINOPHEN 500 MG PO TABS
1000.0000 mg | ORAL_TABLET | Freq: Once | ORAL | Status: AC
  Administered 2017-04-23: 1000 mg via ORAL
  Filled 2017-04-23: qty 2

## 2017-04-23 MED ORDER — IPRATROPIUM-ALBUTEROL 0.5-2.5 (3) MG/3ML IN SOLN
3.0000 mL | Freq: Once | RESPIRATORY_TRACT | Status: AC
  Administered 2017-04-23: 3 mL via RESPIRATORY_TRACT
  Filled 2017-04-23: qty 3

## 2017-04-23 MED ORDER — SPACER/AERO CHAMBER MOUTHPIECE MISC: 1.0000 [IU] | 0 refills | Status: DC | PRN

## 2017-04-23 MED ORDER — ALBUTEROL SULFATE HFA 108 (90 BASE) MCG/ACT IN AERS: 2.0000 | INHALATION_SPRAY | Freq: Four times a day (QID) | RESPIRATORY_TRACT | 2 refills | Status: DC | PRN

## 2017-04-23 MED ORDER — IBUPROFEN 600 MG PO TABS
600.0000 mg | ORAL_TABLET | Freq: Once | ORAL | Status: AC
  Administered 2017-04-23: 600 mg via ORAL
  Filled 2017-04-23: qty 1

## 2017-04-23 NOTE — Discharge Instructions (Signed)
Please take all of your antibiotics as prescribed and return to the emergency department for any concerns such as worsening shortness of breath, worsening pain, or for any other issues. Otherwise follow up with her primary care physician as needed.  It was a pleasure to take care of you today, and thank you for coming to our emergency department.  If you have any questions or concerns before leaving please ask the nurse to grab me and I'm more than happy to go through your aftercare instructions again.  If you were prescribed any opioid pain medication today such as Norco, Vicodin, Percocet, morphine, hydrocodone, or oxycodone please make sure you do not drive when you are taking this medication as it can alter your ability to drive safely.  If you have any concerns once you are home that you are not improving or are in fact getting worse before you can make it to your follow-up appointment, please do not hesitate to call [phone] and come back for further evaluation.  Merrily BrittleNeil Anaaya Fuster, MD  No results found for this or any previous visit. Dg Chest 2 View  Result Date: 04/23/2017 CLINICAL DATA:  Productive cough and hypoxia EXAM: CHEST  2 VIEW COMPARISON:  None. FINDINGS: The heart size and mediastinal contours are within normal limits. Bilateral interstitial prominence consistent with acute bronchitic change. No pneumonic consolidation, effusion or pulmonary edema. The visualized skeletal structures are unremarkable. IMPRESSION: Diffuse interstitial prominence consistent with acute bronchitic change. Otherwise negative exam. Electronically Signed   By: Tollie Ethavid  Kwon M.D.   On: 04/23/2017 03:09

## 2017-04-23 NOTE — ED Notes (Signed)
Pt O2 sat was 91. Put pt on 2L Nasal Cannula.

## 2017-04-23 NOTE — ED Notes (Signed)
Pt taken to xray 

## 2017-04-23 NOTE — ED Provider Notes (Signed)
[facility] Emergency Department Provider Note  ____________________________________________   First MD Initiated Contact with Patient 04/23/17 [phone]     (approximate)  I have reviewed the triage vital signs and the nursing notes.   HISTORY  Chief Complaint Shortness of Breath and Cough    HPI Roger Ponce is a 26 y.o. male who self presents to the emergency department with 4 days of increasingly productive cough and shortness of breath. He has no past medical history aside from childhood asthma but is not had an asthma attack in many years.His girlfriend is sick at home with similar symptoms. He denies fevers or chills. He does have sharp moderate severity chest pain worse when coughing improved when not coughing.   Past Medical History:  Diagnosis Date  . Depression     There are no active problems to display for this patient.   Past Surgical History:  Procedure Laterality Date  . ANKLE RECONSTRUCTION    . TONSILLECTOMY      Prior to Admission medications   Medication Sig Start Date End Date Taking? Authorizing Provider  albuterol (PROVENTIL HFA;VENTOLIN HFA) 108 (90 Base) MCG/ACT inhaler Inhale 2 puffs into the lungs every 6 (six) hours as needed for wheezing or shortness of breath. 04/23/17   Merrily Brittleifenbark, Margee Trentham, MD  azithromycin (ZITHROMAX Z-PAK) 250 MG tablet Take 2 tablets (500 mg) on  Day 1,  followed by 1 tablet (250 mg) once daily on Days 2 through 5. 04/23/17 04/28/17  Merrily Brittleifenbark, Delona Clasby, MD  Spacer/Aero Chamber Mouthpiece MISC 1 Units by Does not apply route every 4 (four) hours as needed (wheezing). 04/23/17   Merrily Brittleifenbark, Lora Glomski, MD    Allergies Morphine and related; Amoxicillin; Demerol [meperidine]; Penicillins; and Sulfa antibiotics  History reviewed. No pertinent family history.  Social History Social History  Substance Use Topics  . Smoking status: Never Smoker  . Smokeless tobacco: Never Used  . Alcohol use No    Review of  Systems Constitutional: No fever/chills Eyes: No visual changes. ENT: No sore throat. Cardiovascular: Positive chest pain. Respiratory: Positive shortness of breath. Gastrointestinal: No abdominal pain.  No nausea, no vomiting.  No diarrhea.  No constipation. Genitourinary: Negative for dysuria. Musculoskeletal: Negative for back pain. Skin: Negative for rash. Neurological: Negative for headaches, focal weakness or numbness.   ____________________________________________   PHYSICAL EXAM:  VITAL SIGNS: ED Triage Vitals  Enc Vitals Group     BP 04/23/17 0223 134/68     Pulse Rate 04/23/17 0223 77     Resp 04/23/17 0223 18     Temp 04/23/17 0223 98.5 F (36.9 C)     Temp Source 04/23/17 0223 Oral     SpO2 04/23/17 0223 94 %     Weight 04/23/17 0224 215 lb (97.5 kg)     Height 04/23/17 0224 6\' 2"  (1.88 m)     Head Circumference --      Peak Flow --      Pain Score 04/23/17 0230 6     Pain Loc --      Pain Edu? --      Excl. in GC? --     Constitutional: Alert and oriented 4 appears mildly short of breath and coughing with productive cough during our exam Eyes: PERRL EOMI. Head: Atraumatic. Nose: No congestion/rhinnorhea. Mouth/Throat: No trismus Neck: No stridor.   Cardiovascular: Normal rate, regular rhythm. Grossly normal heart sounds.  Good peripheral circulation. Respiratory: Slightly increased respiratory effort though not using accessory muscles mild expiratory  wheezes throughout in all fields with mixed rhonchi as well moving good air Gastrointestinal: Soft nontender Musculoskeletal: No lower extremity edema   Neurologic:  Normal speech and language. No gross focal neurologic deficits are appreciated. Skin:  Skin is warm, dry and intact. No rash noted. Psychiatric: Mood and affect are normal. Speech and behavior are normal.    ____________________________________________   DIFFERENTIAL includes but not limited to  Pneumonia, pneumothorax, asthma  exacerbation, allergic reaction, anaphylaxis ____________________________________________   LABS (all labs ordered are listed, but only abnormal results are displayed)  Labs Reviewed - No data to display   __________________________________________  EKG   ____________________________________________  RADIOLOGY  Chest x-ray is consistent with diffuse bronchitis versus atypical pneumonia ____________________________________________   PROCEDURES  Procedure(s) performed: no  Procedures  Critical Care performed: no  Observation: no ____________________________________________   INITIAL IMPRESSION / ASSESSMENT AND PLAN / ED COURSE  Pertinent labs & imaging results that were available during my care of the patient were reviewed by me and considered in my medical decision making (see chart for details).  The patient appears uncomfortable and is hypoxic to the low 90s on room air. He does have some wheezing throughout but is more rhonchorous than Just wheezy. She may very well have pneumonia with a postobstructive wheeze. Breathing treatment x-ray Tylenol and Motrin are pending.   ___----------------------------------------- 4:25 AM on 04/23/2017 -----------------------------------------  The patient is now saturating 100% on room air and his symptoms have largely resolved. He is no longer coughing. He may have a component of reactive airway disease and his chest x-ray is concerning for atypical pneumonia. I'll cover him with azithromycin now as well as albuterol for home. _________________________________________   FINAL CLINICAL IMPRESSION(S) / ED DIAGNOSES  Final diagnoses:  Community acquired pneumonia, unspecified laterality      NEW MEDICATIONS STARTED DURING THIS VISIT:  Discharge Medication List as of 04/23/2017  4:24 AM    START taking these medications   Details  albuterol (PROVENTIL HFA;VENTOLIN HFA) 108 (90 Base) MCG/ACT inhaler Inhale 2 puffs into the  lungs every 6 (six) hours as needed for wheezing or shortness of breath., Starting Mon 04/23/2017, Print    azithromycin (ZITHROMAX Z-PAK) 250 MG tablet Take 2 tablets (500 mg) on  Day 1,  followed by 1 tablet (250 mg) once daily on Days 2 through 5., Print    Spacer/Aero Chamber Mouthpiece MISC 1 Units by Does not apply route every 4 (four) hours as needed (wheezing)., Starting Mon 04/23/2017, Print         Note:  This document was prepared using Dragon voice recognition software and may include unintentional dictation errors.     Merrily Brittle, MD 04/23/17 [phone]

## 2017-04-23 NOTE — ED Triage Notes (Signed)
Pt presents worsening symptoms over the last four days of shortness of breath, productive cough of green sputum, wheezing, sore throat and sinus drainage; sats 91-94% in triage; lungs with wheezing throughout; pt says symptoms worsened when he tried to lay down to sleep

## 2017-09-26 ENCOUNTER — Emergency Department: Payer: Non-veteran care

## 2017-09-26 ENCOUNTER — Emergency Department
Admission: EM | Admit: 2017-09-26 | Discharge: 2017-09-26 | Disposition: A | Discharge status: Home / Self Care | Payer: Non-veteran care | Attending: Emergency Medicine | Admitting: Emergency Medicine

## 2017-09-26 ENCOUNTER — Encounter: Payer: Self-pay | Admitting: *Deleted

## 2017-09-26 ENCOUNTER — Other Ambulatory Visit: Payer: Self-pay

## 2017-09-26 DIAGNOSIS — X500XXA Overexertion from strenuous movement or load, initial encounter: Secondary | ICD-10-CM | POA: Insufficient documentation

## 2017-09-26 DIAGNOSIS — S43014A Anterior dislocation of right humerus, initial encounter: Secondary | ICD-10-CM | POA: Insufficient documentation

## 2017-09-26 DIAGNOSIS — Y999 Unspecified external cause status: Secondary | ICD-10-CM | POA: Diagnosis not present

## 2017-09-26 DIAGNOSIS — M25511 Pain in right shoulder: Secondary | ICD-10-CM

## 2017-09-26 DIAGNOSIS — Y939 Activity, unspecified: Secondary | ICD-10-CM | POA: Insufficient documentation

## 2017-09-26 DIAGNOSIS — Y929 Unspecified place or not applicable: Secondary | ICD-10-CM | POA: Insufficient documentation

## 2017-09-26 MED ORDER — PROPOFOL 10 MG/ML IV BOLUS
INTRAVENOUS | Status: AC | PRN
  Administered 2017-09-26: 50 mg via INTRAVENOUS

## 2017-09-26 MED ORDER — KETOROLAC TROMETHAMINE 30 MG/ML IJ SOLN
15.0000 mg | INTRAMUSCULAR | Status: AC
  Administered 2017-09-26: 15 mg via INTRAVENOUS
  Filled 2017-09-26: qty 1

## 2017-09-26 MED ORDER — SODIUM CHLORIDE 0.9 % IV BOLUS (SEPSIS)
1000.0000 mL | Freq: Once | INTRAVENOUS | Status: AC
  Administered 2017-09-26: 1000 mL via INTRAVENOUS

## 2017-09-26 MED ORDER — PROPOFOL 10 MG/ML IV BOLUS
1.0000 mg/kg | Freq: Once | INTRAVENOUS | Status: AC
  Administered 2017-09-26: 99.8 mg via INTRAVENOUS
  Filled 2017-09-26: qty 20

## 2017-09-26 NOTE — Discharge Instructions (Signed)
You should follow up with orthopedics as soon as possible. Each dislocation risks further injury to your shoulder joint causing worsened long-term instability. You will need to have an MRI performed of both shoulders and likely have surgery of both shoulders.

## 2017-09-26 NOTE — ED Notes (Signed)
Pt discharged to home.  Family member driving.  Discharge instructions reviewed.  Verbalized understanding.  No questions or concerns at this time.  Teach back verified.  Pt in NAD.  No items left in ED.   

## 2017-09-26 NOTE — ED Notes (Signed)
Consent signed by pt's wife per pt request at this time.  Pt's R shoulder in too much pain.

## 2017-09-26 NOTE — ED Provider Notes (Signed)
[facility] Emergency Department Provider Note  ____________________________________________  Time seen: Approximately 9:55 PM  I have reviewed the triage vital signs and the nursing notes.   HISTORY  Chief Complaint Shoulder Pain    HPI Roger Ponce is a 27 y.o. male who complains of right shoulder pain that started suddenly when he was pulling a gym bag out of his car, just prior to arrival here. Last ate at 4 PM today. Complains of some paresthesias in his right fifth finger, otherwise no weakness or coldness. He reports a total of about 20 dislocations between his 2 shoulders. 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 He is awaiting follow-up at the [place] to further evaluate this. Denies any other injuries.     Past Medical History:  Diagnosis Date  . Depression      There are no active problems to display for this patient.    Past Surgical History:  Procedure Laterality Date  . ANKLE RECONSTRUCTION    . TONSILLECTOMY       Prior to Admission medications   Medication Sig Start Date End Date Taking? Authorizing Provider  albuterol (PROVENTIL HFA;VENTOLIN HFA) 108 (90 Base) MCG/ACT inhaler Inhale 2 puffs into the lungs every 6 (six) hours as needed for wheezing or shortness of breath. 04/23/17   Merrily Brittle, MD  Spacer/Aero Chamber Mouthpiece MISC 1 Units by Does not apply route every 4 (four) hours as needed (wheezing). 04/23/17   Merrily Brittle, MD     Allergies Morphine and related; Amoxicillin; Demerol [meperidine]; Penicillins; and Sulfa antibiotics   No family history on file.  Social History Social History   Tobacco Use  . Smoking status: Never Smoker  . Smokeless tobacco: Never Used  Substance Use Topics  . Alcohol use: No  . Drug use: No    Review of Systems  Constitutional:   No fever or chills.  ENT:   No sore throat. No rhinorrhea. Cardiovascular:   No chest pain or syncope. Respiratory:   No dyspnea or  cough. Gastrointestinal:   Negative for abdominal pain, vomiting and diarrhea.  Musculoskeletal:  Right shoulder pain as above All other systems reviewed and are negative except as documented above in ROS and HPI.  ____________________________________________   PHYSICAL EXAM:  VITAL SIGNS: ED Triage Vitals  Enc Vitals Group     BP 09/26/17 2012 (!) 151/46     Pulse Rate 09/26/17 2012 62     Resp 09/26/17 2012 18     Temp 09/26/17 2012 98.7 F (37.1 C)     Temp Source 09/26/17 2012 Oral     SpO2 09/26/17 2012 99 %     Weight 09/26/17 2010 220 lb (99.8 kg)     Height 09/26/17 2010 6\' 2"  (1.88 m)     Head Circumference --      Peak Flow --      Pain Score 09/26/17 2010 9     Pain Loc --      Pain Edu? --      Excl. in GC? --     Vital signs reviewed, nursing assessments reviewed.   Constitutional:   Alert and oriented. Well appearing and in no distress. Eyes:   No scleral icterus.  EOMI. [facility]   Head:   Normocephalic and atraumatic.   Nose:   No congestion/rhinnorhea.    Mouth/Throat:   MMM, no pharyngeal erythema. No peritonsillar mass.    Neck:   No meningismus. Full ROM. Hematological/Lymphatic/Immunilogical:  No cervical lymphadenopathy. Cardiovascular:   RRR. Symmetric bilateral radial and DP pulses.  No murmurs.  Respiratory:   Normal respiratory effort without tachypnea/retractions. Breath sounds are clear and equal bilaterally. No wheezes/rales/rhonchi. Gastrointestinal:   Soft and nontender. Non distended. There is no CVA tenderness.  No rebound, rigidity, or guarding. Genitourinary:   deferred Musculoskeletal:   Right arm held in internal rotation supported by contralateral hand. There is an empty subacromial space, humeral head palpable anteriorly. Pain with any attempted range of motion of the right shoulder. Left shoulder unaffected and normal. Neurologic:   Normal speech and language.  Motor grossly intact. No gross focal neurologic deficits  are appreciated.  Skin:    Skin is warm, dry and intact. No rash noted.  No petechiae, purpura, or bullae.  ____________________________________________    LABS (pertinent positives/negatives) (all labs ordered are listed, but only abnormal results are displayed) Labs Reviewed - No data to display ____________________________________________   EKG    ____________________________________________    RADIOLOGY  Dg Shoulder Right  Result Date: 09/26/2017 CLINICAL DATA:  RIGHT shoulder pain, multiple dislocations in past (12 times per patient), was lifting a bag out of the trunk of his car and felt shoulder dislocate EXAM: RIGHT SHOULDER - 2+ VIEW COMPARISON:  02/04/2017 FINDINGS: Osseous mineralization normal. AC joint alignment normal. Anterior glenohumeral dislocation present. No fractures identified. Visualized ribs intact. IMPRESSION: Recurrent anterior RIGHT glenohumeral dislocation. Electronically Signed   By: Ulyses Southward M.D.   On: 09/26/2017 20:32    ____________________________________________   PROCEDURES .Sedation Date/Time: 09/26/2017 9:30 PM Performed by: Sharman Cheek, MD Authorized by: Sharman Cheek, MD   Consent:    Consent obtained:  Written (electronic informed consent)   Consent given by:  Patient and spouse   Risks discussed:  Allergic reaction, dysrhythmia, inadequate sedation, nausea, vomiting, respiratory compromise necessitating ventilatory assistance and intubation, prolonged sedation necessitating reversal and prolonged hypoxia resulting in organ damage   Alternatives discussed:  Analgesia without sedation and anxiolysis Universal protocol:    Procedure explained and questions answered to patient or proxy's satisfaction: yes     Relevant documents present and verified: yes     Test results available and properly labeled: yes     Imaging studies available: yes     Required blood products, implants, devices, and special equipment available: yes      Immediately prior to procedure a time out was called: yes     Patient identity confirmation method:  Arm band and verbally with patient Indications:    Procedure performed:  Dislocation reduction   Procedure necessitating sedation performed by:  Physician performing sedation   Intended level of sedation:  Moderate (conscious sedation) Pre-sedation assessment:    Time since last food or drink:  5.5 hours   ASA classification: class 1 - normal, healthy patient     Neck mobility: normal     Mouth opening:  3 or more finger widths   Thyromental distance:  4 finger widths   Mallampati score:  I - soft palate, uvula, fauces, pillars visible   Pre-sedation assessments completed and reviewed: airway patency, cardiovascular function, hydration status, mental status, nausea/vomiting, pain level, respiratory function and temperature     Pre-sedation assessment completed:  09/26/2017 9:00 PM Immediate pre-procedure details:    Reassessment: Patient reassessed immediately prior to procedure     Reviewed: vital signs, relevant labs/tests and NPO status     Verified: bag valve mask available, emergency equipment available, intubation equipment available, IV patency confirmed,  oxygen available, reversal medications available and suction available   Procedure details (see MAR for exact dosages):    Sedation:  Propofol   Analgesia: toradol.   Intra-procedure monitoring:  Blood pressure monitoring, continuous pulse oximetry, cardiac monitor, frequent vital sign checks and frequent LOC assessments   Intra-procedure events: none     Total Provider sedation time (minutes):  20 Post-procedure details:    Post-sedation assessment completed:  09/26/2017 9:57 PM   Attendance: Constant attendance by certified staff until patient recovered     Recovery: Patient returned to pre-procedure baseline     Post-sedation assessments completed and reviewed: airway patency, cardiovascular function, hydration status, mental  status, nausea/vomiting, pain level and respiratory function     Patient is stable for discharge or admission: yes     Patient tolerance:  Tolerated well, no immediate complications Comments:     Started with 0.5mg /kg propofol (50mg ), increased in 50mg  increments to total of 150mg  to reach desired level of sedation . Reduction of dislocation Date/Time: 09/26/2017 9:59 PM Performed by: Sharman CheekStafford, Kaisyn Reinhold, MD Authorized by: Sharman CheekStafford, Harbor Paster, MD  Consent: Verbal consent obtained. Written consent obtained. Risks and benefits: risks, benefits and alternatives were discussed Consent given by: patient and spouse Patient understanding: patient states understanding of the procedure being performed Patient consent: the patient's understanding of the procedure matches consent given Procedure consent: procedure consent matches procedure scheduled Relevant documents: relevant documents present and verified Test results: test results available and properly labeled Imaging studies: imaging studies available Patient identity confirmed: verbally with patient and arm band Time out: Immediately prior to procedure a "time out" was called to verify the correct patient, procedure, equipment, support staff and site/side marked as required.  Sedation: Patient sedated: yes Sedatives: propofol Analgesia: see MAR for details  Patient tolerance: Patient tolerated the procedure well with no immediate complications Comments: Easy reduction, traction/countertraction .     ____________________________________________    CLINICAL IMPRESSION / ASSESSMENT AND PLAN / ED COURSE  Pertinent labs & imaging results that were available during my care of the patient were reviewed by me and considered in my medical decision making (see chart for details).   Patient well-appearing no acute distress, presents with right shoulder dislocation. This is happened many times. Offered to attempt reduction with just pain control  and avoiding the sedation, patient declines and wishes to have sedation because he reports he is had multiple prior dislocations and no wheeze been successful in reducing it without sedation. Patient was sedated uneventfully, shoulder was reduced, and he quickly return to normal mental status. He is awaiting follow-up at the [facility]. Advised him the risk of further joint damage with each dislocation, 2 continue pressing for orthopedic follow-up as soon as possible      ____________________________________________   FINAL CLINICAL IMPRESSION(S) / ED DIAGNOSES    Final diagnoses:  None       Portions of this note were generated with dragon dictation software. Dictation errors may occur despite best attempts at proofreading.    Sharman CheekStafford, Anie Juniel, MD 09/26/17 2203

## 2017-09-26 NOTE — ED Triage Notes (Signed)
Patient to ER for right sided shoulder pain. Patient has h/o multiple times of dislocation ("12 times" per patient). [facility]). Patient states this time, he was lifting bag out of his trunk and felt shoulder dislocate.

## 2017-09-26 NOTE — ED Triage Notes (Signed)
Pt states he was getting his gym bag out of the car and his right shoulder dislocated.  Deformity noted.

## 2017-12-02 ENCOUNTER — Other Ambulatory Visit: Payer: Self-pay

## 2017-12-02 ENCOUNTER — Emergency Department: Payer: Non-veteran care

## 2017-12-02 ENCOUNTER — Encounter: Payer: Self-pay | Admitting: Emergency Medicine

## 2017-12-02 ENCOUNTER — Emergency Department
Admission: EM | Admit: 2017-12-02 | Discharge: 2017-12-02 | Disposition: A | Discharge status: Home / Self Care | Payer: Non-veteran care | Attending: Emergency Medicine | Admitting: Emergency Medicine

## 2017-12-02 DIAGNOSIS — Y999 Unspecified external cause status: Secondary | ICD-10-CM | POA: Diagnosis not present

## 2017-12-02 DIAGNOSIS — S43014A Anterior dislocation of right humerus, initial encounter: Secondary | ICD-10-CM | POA: Insufficient documentation

## 2017-12-02 DIAGNOSIS — Y929 Unspecified place or not applicable: Secondary | ICD-10-CM | POA: Insufficient documentation

## 2017-12-02 DIAGNOSIS — S4991XA Unspecified injury of right shoulder and upper arm, initial encounter: Secondary | ICD-10-CM | POA: Diagnosis present

## 2017-12-02 DIAGNOSIS — Y9384 Activity, sleeping: Secondary | ICD-10-CM | POA: Insufficient documentation

## 2017-12-02 DIAGNOSIS — X500XXA Overexertion from strenuous movement or load, initial encounter: Secondary | ICD-10-CM | POA: Diagnosis not present

## 2017-12-02 MED ORDER — PROPOFOL 10 MG/ML IV BOLUS
INTRAVENOUS | Status: AC | PRN
  Administered 2017-12-02: 100 mg via INTRAVENOUS

## 2017-12-02 MED ORDER — HYDROMORPHONE HCL 1 MG/ML IJ SOLN
1.0000 mg | Freq: Once | INTRAMUSCULAR | Status: AC
  Administered 2017-12-02: 1 mg via INTRAVENOUS

## 2017-12-02 MED ORDER — PROPOFOL 10 MG/ML IV BOLUS
INTRAVENOUS | Status: AC | PRN
  Administered 2017-12-02 (×2): 50 mg via INTRAVENOUS

## 2017-12-02 MED ORDER — PROPOFOL 10 MG/ML IV BOLUS
INTRAVENOUS | Status: AC
  Filled 2017-12-02: qty 20

## 2017-12-02 MED ORDER — KETOROLAC TROMETHAMINE 30 MG/ML IJ SOLN
30.0000 mg | Freq: Once | INTRAMUSCULAR | Status: AC
  Administered 2017-12-02: 30 mg via INTRAVENOUS
  Filled 2017-12-02: qty 1

## 2017-12-02 MED ORDER — HYDROMORPHONE HCL 1 MG/ML IJ SOLN
INTRAMUSCULAR | Status: AC
  Administered 2017-12-02: 1 mg via INTRAVENOUS
  Filled 2017-12-02: qty 1

## 2017-12-02 NOTE — ED Notes (Signed)
Pt fully awake. Responds to all request to have post procedure xray taken. Able to speak in full and complete sentences and knows the day and date.

## 2017-12-02 NOTE — ED Triage Notes (Signed)
Pt reports that he was slleping and had a bad dream and jumped causing his shoulder to come out of place. Pt has multiple encounters of the same in the past. Pt is ambulatory at this time. Right shoulder is deformed upon visualization.

## 2017-12-02 NOTE — ED Notes (Signed)
Discharge instructions reviewed with wife and copy of pre sedation discharge instructions that pt signed given to pt and wife with discharge papers. Pt and wife both verbalize understanding of discharge instructions.

## 2017-12-02 NOTE — ED Provider Notes (Signed)
[facility] Emergency Department Provider Note ____________________________________________   First MD Initiated Contact with Patient 12/02/17 [phone]     (approximate)  I have reviewed the triage vital signs and the nursing notes.   HISTORY  Chief Complaint Shoulder Injury    HPI Roger Ponce is a 27 y.o. male with past medical history as noted below and a history of multiple prior shoulder dislocations who presents with an apparent right shoulder dislocation, acute onset while he was sleeping.  Patient states he may have had a bad dream and jumped, but he has also dislocated from rolling over previously.  He reports some numbness and tingling in the right hand.  No other injuries.   Past Medical History:  Diagnosis Date  . Depression     There are no active problems to display for this patient.   Past Surgical History:  Procedure Laterality Date  . ANKLE RECONSTRUCTION    . TONSILLECTOMY      Prior to Admission medications   Medication Sig Start Date End Date Taking? Authorizing Provider  albuterol (PROVENTIL HFA;VENTOLIN HFA) 108 (90 Base) MCG/ACT inhaler Inhale 2 puffs into the lungs every 6 (six) hours as needed for wheezing or shortness of breath. 04/23/17   Merrily Brittle, MD  Spacer/Aero Chamber Mouthpiece MISC 1 Units by Does not apply route every 4 (four) hours as needed (wheezing). 04/23/17   Merrily Brittle, MD    Allergies Morphine and related; Amoxicillin; Demerol [meperidine]; Penicillins; and Sulfa antibiotics  No family history on file.  Social History Social History   Tobacco Use  . Smoking status: Never Smoker  . Smokeless tobacco: Never Used  Substance Use Topics  . Alcohol use: No  . Drug use: No    Review of Systems  Constitutional: No fever. Cardiovascular: Denies chest pain. Respiratory: Denies shortness of breath. Gastrointestinal: No vomiting. Musculoskeletal: Positive for right shoulder pain. Skin:  Negative for rash. Neurological: Negative for headache.   ____________________________________________   PHYSICAL EXAM:  VITAL SIGNS: ED Triage Vitals  Enc Vitals Group     BP 12/02/17 0242 (!) 141/78     Pulse Rate 12/02/17 0242 81     Resp 12/02/17 0242 18     Temp --      Temp src --      SpO2 12/02/17 0242 99 %     Weight 12/02/17 0232 225 lb (102.1 kg)     Height 12/02/17 0232 6\' 2"  (1.88 m)     Head Circumference --      Peak Flow --      Pain Score 12/02/17 0232 8     Pain Loc --      Pain Edu? --      Excl. in GC? --     Constitutional: Alert and oriented.  Slightly uncomfortable appearing but in no acute distress. Eyes: Conjunctivae are normal.  Head: Atraumatic. Nose: No congestion/rhinnorhea. Mouth/Throat: Mucous membranes are moist.   Neck: Normal range of motion.  Cardiovascular: Good peripheral circulation. Respiratory: Normal respiratory effort.  No retractions.  Gastrointestinal:  No distention.  Musculoskeletal: Right shoulder deformity.  2+ radial pulse.  No bony tenderness. Neurologic: Motor and sensory intact in median, radial, ulnar, and axillary distributions in right arm. Skin:  Skin is warm and dry. No rash noted. Psychiatric: Mood and affect are normal. Speech and behavior are normal.  ____________________________________________   LABS (all labs ordered are listed, but only abnormal results are displayed)  Labs Reviewed - No  data to display ____________________________________________  EKG   ____________________________________________  RADIOLOGY  XR R shoulder: Anterior dislocation  ____________________________________________   PROCEDURES  Procedure(s) performed: No  .Sedation Date/Time: 12/02/2017 3:45 AM Performed by: Dionne Bucy, MD Authorized by: Dionne Bucy, MD   Consent:    Consent obtained:  Written   Consent given by:  Patient   Risks discussed:  Respiratory compromise necessitating  ventilatory assistance and intubation, inadequate sedation and vomiting   Alternatives discussed:  Analgesia without sedation and anxiolysis Universal protocol:    Immediately prior to procedure a time out was called: yes   Indications:    Procedure performed:  Dislocation reduction   Procedure necessitating sedation performed by:  Physician performing sedation   Intended level of sedation:  Moderate (conscious sedation) Pre-sedation assessment:    Time since last food or drink:  4h   ASA classification: class 1 - normal, healthy patient     Mallampati score:  I - soft palate, uvula, fauces, pillars visible   Pre-sedation assessments completed and reviewed: airway patency, cardiovascular function, mental status, pain level and respiratory function   Immediate pre-procedure details:    Reviewed: vital signs     Verified: bag valve mask available   Procedure details (see MAR for exact dosages):    Preoxygenation:  Nasal cannula   Sedation:  Propofol   Intra-procedure monitoring:  Continuous capnometry, cardiac monitor and continuous pulse oximetry   Intra-procedure events: none     Total Provider sedation time (minutes):  8 Post-procedure details:    Attendance: Constant attendance by certified staff until patient recovered     Recovery: Patient returned to pre-procedure baseline     Post-sedation assessments completed and reviewed: airway patency, mental status and respiratory function     Patient tolerance:  Tolerated well, no immediate complications Reduction of R shoulder dislocation Date/Time: 12/02/2017 3:47 AM Performed by: Dionne Bucy, MD Authorized by: Dionne Bucy, MD  Consent: Written consent obtained. Risks and benefits: risks, benefits and alternatives were discussed Consent given by: patient Patient identity confirmed: verbally with patient and arm band Time out: Immediately prior to procedure a "time out" was called to verify the correct patient,  procedure, equipment, support staff and site/side marked as required. Local anesthesia used: no  Anesthesia: Local anesthesia used: no  Sedation: Patient sedated: yes  Patient tolerance: Patient tolerated the procedure well with no immediate complications Comments: Single attempt via traction/countertraction, successful reduction.  Confirmed by post reduction XR.      Critical Care performed: No ____________________________________________   INITIAL IMPRESSION / ASSESSMENT AND PLAN / ED COURSE  Pertinent labs & imaging results that were available during my care of the patient were reviewed by me and considered in my medical decision making (see chart for details).  27 year old male with past history of frequent prior shoulder dislocations presents with right shoulder deformity and pain which she awoke with, and believes he may have had a bad dream and caused a sudden movement while he was sleeping.  Right shoulder is deformed consistent with anterior dislocation, and the right arm is neuro/vascular intact.  X-ray confirms anterior shoulder dislocation.  Patient has required sedation in the past, so we will do a moderate sedation with propofol for reduction.     ----------------------------------------- 4:36 AM on 12/02/2017 -----------------------------------------  Shoulder reduced successfully on first attempt.  Patient is now fully awake and back to his baseline mental status.  Safe for discharge home.  [facility]  ____________________________________________   FINAL CLINICAL IMPRESSION(S) / ED DIAGNOSES  Final diagnoses:  Anterior dislocation of right shoulder, initial encounter      NEW MEDICATIONS STARTED DURING THIS VISIT:  New Prescriptions   No medications on file     Note:  This document was prepared using Dragon voice recognition software and may include unintentional dictation errors.    Dionne BucySiadecki,  Kelita Wallis, MD 12/02/17 [phone]

## 2018-05-27 ENCOUNTER — Other Ambulatory Visit: Payer: Self-pay

## 2018-05-27 ENCOUNTER — Encounter: Payer: Self-pay | Admitting: Emergency Medicine

## 2018-05-27 ENCOUNTER — Emergency Department
Admission: EM | Admit: 2018-05-27 | Discharge: 2018-05-27 | Disposition: A | Discharge status: Home / Self Care | Payer: BLUE CROSS/BLUE SHIELD | Attending: Emergency Medicine | Admitting: Emergency Medicine

## 2018-05-27 DIAGNOSIS — Z79899 Other long term (current) drug therapy: Secondary | ICD-10-CM | POA: Diagnosis not present

## 2018-05-27 DIAGNOSIS — R21 Rash and other nonspecific skin eruption: Secondary | ICD-10-CM | POA: Diagnosis present

## 2018-05-27 DIAGNOSIS — H1033 Unspecified acute conjunctivitis, bilateral: Secondary | ICD-10-CM | POA: Insufficient documentation

## 2018-05-27 DIAGNOSIS — L739 Follicular disorder, unspecified: Secondary | ICD-10-CM | POA: Diagnosis not present

## 2018-05-27 MED ORDER — POLYMYXIN B-TRIMETHOPRIM 10000-0.1 UNIT/ML-% OP SOLN: 1.0000 [drp] | Freq: Four times a day (QID) | OPHTHALMIC | 0 refills | Status: AC

## 2018-05-27 NOTE — ED Triage Notes (Signed)
Patient was seen at urgent care for this issue yesterday. States he has a scratch on testicle and was told he it was a yeast infection and was given a cream. Patient states he has bad anxiety and wants someone else to look at it to make sure it is nothing serious.patient denies any discharge or pain.
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
## 2018-05-31 NOTE — ED Notes (Signed)
Patient called asking for call in of eye drops as he left paper rx at home and is in [place] on vacation--says eyes are getting worse.  I called the eye drops to [facility]  [address] [place], [place] [id number]  [phone]) [phone]

## 2019-06-10 ENCOUNTER — Ambulatory Visit
Admission: EM | Admit: 2019-06-10 | Discharge: 2019-06-10 | Disposition: A | Discharge status: Home / Self Care | Payer: No Typology Code available for payment source

## 2019-06-10 ENCOUNTER — Other Ambulatory Visit: Payer: Self-pay | Admitting: Emergency Medicine

## 2019-06-10 ENCOUNTER — Other Ambulatory Visit: Payer: Self-pay

## 2019-06-10 DIAGNOSIS — R300 Dysuria: Secondary | ICD-10-CM | POA: Diagnosis not present

## 2019-06-10 DIAGNOSIS — R350 Frequency of micturition: Secondary | ICD-10-CM | POA: Insufficient documentation

## 2019-06-10 LAB — POCT URINALYSIS DIP (MANUAL ENTRY)
Bilirubin, UA: NEGATIVE
Blood, UA: NEGATIVE
Glucose, UA: NEGATIVE mg/dL
Leukocytes, UA: NEGATIVE
Nitrite, UA: NEGATIVE
Protein Ur, POC: NEGATIVE mg/dL
Spec Grav, UA: 1.025 (ref 1.010–1.025)
Urobilinogen, UA: 0.2 E.U./dL
pH, UA: 6 (ref 5.0–8.0)

## 2019-06-10 MED ORDER — PHENAZOPYRIDINE HCL 200 MG PO TABS: 200.0000 mg | ORAL_TABLET | Freq: Three times a day (TID) | ORAL | 0 refills | Status: AC
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
## 2019-06-10 NOTE — Discharge Instructions (Addendum)
Urine did not show signs of UTI Urine culture sent.  We will send your urine out to culture and call you with abnormal results.   Urine cytology obtained Push fluids and get plenty of rest.   Take pyridium as prescribed and as needed for symptomatic relief Follow up with PCP.  You may follow up with Dr. Holly Bodily if you do not have a PCP.  You may need a referral to urology Return here or go to ER if you have any new or worsening symptoms such as fever, abdominal pain, nausea/vomiting, flank pain, changes in bowel habits, etc..Marland Kitchen

## 2019-06-10 NOTE — ED Triage Notes (Signed)
Pt presents to UC stating he was seen last week for STI symptoms. He received treatment 1 week ago and finished his prescribed medications. He states he is still having burning on urination and urgency. Pt would like to be tested for UTI.

## 2019-06-11 LAB — URINE CULTURE: Culture: NO GROWTH

## 2019-06-12 LAB — URINE CYTOLOGY ANCILLARY ONLY
Chlamydia: NEGATIVE
Neisseria Gonorrhea: NEGATIVE
Trichomonas: NEGATIVE

## 2019-09-30 ENCOUNTER — Encounter: Payer: Self-pay | Admitting: Emergency Medicine

## 2019-09-30 ENCOUNTER — Emergency Department: Payer: No Typology Code available for payment source

## 2019-09-30 ENCOUNTER — Emergency Department
Admission: EM | Admit: 2019-09-30 | Discharge: 2019-09-30 | Disposition: A | Discharge status: Home / Self Care | Payer: No Typology Code available for payment source | Attending: Emergency Medicine | Admitting: Emergency Medicine

## 2019-09-30 ENCOUNTER — Other Ambulatory Visit: Payer: Self-pay

## 2019-09-30 DIAGNOSIS — F17210 Nicotine dependence, cigarettes, uncomplicated: Secondary | ICD-10-CM | POA: Insufficient documentation

## 2019-09-30 DIAGNOSIS — Y998 Other external cause status: Secondary | ICD-10-CM | POA: Insufficient documentation

## 2019-09-30 DIAGNOSIS — Y9389 Activity, other specified: Secondary | ICD-10-CM | POA: Diagnosis not present

## 2019-09-30 DIAGNOSIS — Y929 Unspecified place or not applicable: Secondary | ICD-10-CM | POA: Insufficient documentation

## 2019-09-30 DIAGNOSIS — M8448XA Pathological fracture, other site, initial encounter for fracture: Secondary | ICD-10-CM | POA: Diagnosis not present

## 2019-09-30 DIAGNOSIS — M542 Cervicalgia: Secondary | ICD-10-CM | POA: Diagnosis present

## 2019-09-30 MED ORDER — TRAMADOL HCL 50 MG PO TABS: 50.0000 mg | ORAL_TABLET | Freq: Four times a day (QID) | ORAL | 0 refills | Status: AC | PRN

## 2019-09-30 NOTE — Discharge Instructions (Addendum)
Advised to wear cervical support pending evaluation by orthopedics.  Call today to schedule appointment.
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
## 2019-09-30 NOTE — ED Notes (Signed)
Pt reports taking 1200 mg ibuprofen at approx 0530 this am, nothing additional since then

## 2020-01-23 ENCOUNTER — Ambulatory Visit
Admission: EM | Admit: 2020-01-23 | Discharge: 2020-01-23 | Disposition: A | Discharge status: Home / Self Care | Payer: BC Managed Care – PPO | Attending: Urgent Care | Admitting: Urgent Care

## 2020-01-23 ENCOUNTER — Encounter: Payer: Self-pay | Admitting: Emergency Medicine

## 2020-01-23 ENCOUNTER — Other Ambulatory Visit: Payer: Self-pay

## 2020-01-23 DIAGNOSIS — Z711 Person with feared health complaint in whom no diagnosis is made: Secondary | ICD-10-CM | POA: Diagnosis present

## 2020-01-23 DIAGNOSIS — Z113 Encounter for screening for infections with a predominantly sexual mode of transmission: Secondary | ICD-10-CM | POA: Insufficient documentation

## 2020-01-23 DIAGNOSIS — Z202 Contact with and (suspected) exposure to infections with a predominantly sexual mode of transmission: Secondary | ICD-10-CM | POA: Diagnosis present

## 2020-01-23 LAB — URINALYSIS, COMPLETE (UACMP) WITH MICROSCOPIC
Glucose, UA: NEGATIVE mg/dL
Hgb urine dipstick: NEGATIVE
Leukocytes,Ua: NEGATIVE
Nitrite: NEGATIVE
Protein, ur: 30 mg/dL — AB
RBC / HPF: NONE SEEN RBC/hpf (ref 0–5)
Specific Gravity, Urine: >1.03 — ABNORMAL HIGH (ref 1.005–1.030)
pH: 5.5 (ref 5.0–8.0)

## 2020-01-23 LAB — CHLAMYDIA/NGC RT PCR (ARMC ONLY)
Chlamydia Tr: NOT DETECTED
N gonorrhoeae: NOT DETECTED

## 2020-01-23 MED ORDER — DOXYCYCLINE HYCLATE 100 MG PO CAPS: 100.0000 mg | ORAL_CAPSULE | Freq: Two times a day (BID) | ORAL | 0 refills | Status: AC

## 2020-01-23 MED ORDER — CEFTRIAXONE SODIUM 500 MG IJ SOLR
500.0000 mg | Freq: Once | INTRAMUSCULAR | Status: AC
  Administered 2020-01-23: 500 mg via INTRAMUSCULAR

## 2020-01-23 NOTE — ED Provider Notes (Addendum)
[place], [place]   Name: Roger Ponce DOB: 1991/08/21 MRN: [id number] CSN: [id number] PCP: Patient, No Pcp Per  Arrival date and time:  01/23/20 1100  Chief Complaint:  Exposure to STD  NOTE: Prior to seeing the patient today, I have reviewed the triage nursing documentation and vital signs. Clinical staff has updated patient's PMH/PSHx, current medication list, and drug allergies/intolerances to ensure comprehensive history available to assist in medical decision making.   History:   HPI: Roger Ponce is a 29 y.o. male who presents today with complaints of a known exposure to an STI. Patient advising that he recently learned of his girlfriend's infidelity. Girlfriend contacted patient to advise him of her recent testing; tested (+) for both chlamydia and gonorrhea. Patient presents today with complaints of dysuria and white penile discharge. He has not appreciated any gross hematuria, nor has he noticed his urine being malodorous. Patient denies any associated nausea, vomiting, fever, or chills. He has not experienced any pain in his lower back, flank area, or abdomen. Patient advises that he does not have a past medical history that is significant for recurrent urinary tract infections, STIs, or urolithiasis. Patient denies that he engages in unprotected sexual activity. He reports that during last sexual encounter with his girlfriend the prophylactic he was using failed, thus causing the exposure. He denies pain in his penis, testicles, or scrotum; no swelling. No penile bleeding or pain. Patient presents to clinic today very anxious. He is requesting prophylactic treatment both gonorrhea and chlamydia.   Past Medical History:  Diagnosis Date  . Depression     Past Surgical History:  Procedure Laterality Date  . ANKLE RECONSTRUCTION    . TONSILLECTOMY      Family History  Problem Relation Age of Onset  . Healthy Mother   . Healthy Father     Social History   Tobacco  Use  . Smoking status: Current Every Day Smoker    Packs/day: 0.50    Types: Cigarettes  . Smokeless tobacco: Never Used  Substance Use Topics  . Alcohol use: No  . Drug use: No    There are no problems to display for this patient.   Home Medications:    Current Meds  Medication Sig  . FLUoxetine (PROZAC) 20 MG capsule TAKE 1 CAPSULE BY MOUTH EVERY DAY FOR 7 DAYS THEN TAKE 2 CAPSULES BY MOUTH EVERY DAY  . hydrOXYzine (ATARAX/VISTARIL) 25 MG tablet Take 25 mg by mouth 3 (three) times daily as needed.    Allergies:   Morphine and related, Amoxicillin, Demerol [meperidine], Penicillins, and Sulfa antibiotics  Review of Systems (ROS):  Review of systems NEGATIVE unless otherwise noted in narrative H&P section.   Vital Signs: Today's Vitals   01/23/20 1114 01/23/20 1117 01/23/20 1211  BP:  120/76   Pulse:  93   Resp:  16   Temp:  98.5 F (36.9 C)   TempSrc:  Oral   SpO2:  99%   Weight: 215 lb (97.5 kg)    Height: 5\' 7"  (1.702 m)    PainSc:   0-No pain    Physical Exam: Physical Exam  Constitutional: He is oriented to person, place, and time and well-developed, well-nourished, and in no distress.  HENT:  Head: Normocephalic and atraumatic.  Eyes: Pupils are equal, round, and reactive to light.  Cardiovascular: Normal rate, regular rhythm, normal heart sounds and intact distal pulses.  Pulmonary/Chest: Effort normal and breath sounds normal.  Abdominal: Soft. Normal appearance and  bowel sounds are normal. He exhibits no distension. There is no abdominal tenderness. There is no CVA tenderness.  Genitourinary:    Testes/scrotum normal.  Penis exhibits no lesions and no edema. White and discharge found.  Lymphadenopathy:       Right: No inguinal adenopathy present.       Left: No inguinal adenopathy present.  Neurological: He is alert and oriented to person, place, and time. Gait normal.  Skin: Skin is warm and dry. No rash noted. He is not diaphoretic.  Psychiatric:  Memory, affect and judgment normal. His mood appears anxious.  Nursing note and vitals reviewed.   Urgent Care Treatments / Results:   Orders Placed This Encounter  Procedures  . Chlamydia/NGC rt PCR ([facility] only)  . Urinalysis, Complete w Microscopic    LABS: PLEASE NOTE: all labs that were ordered this encounter are listed, however only abnormal results are displayed. Labs Reviewed  URINALYSIS, COMPLETE (UACMP) WITH MICROSCOPIC - Abnormal; Notable for the following components:      Result Value   Specific Gravity, Urine >1.030 (*)    Bilirubin Urine MODERATE (*)    Ketones, ur TRACE (*)    Protein, ur 30 (*)    Bacteria, UA FEW (*)    All other components within normal limits  CHLAMYDIA/NGC RT PCR ([facility] ONLY)    EKG: -None  RADIOLOGY: No results found.  PROCEDURES: Procedures  MEDICATIONS RECEIVED THIS VISIT: Medications  cefTRIAXone (ROCEPHIN) injection 500 mg (500 mg Intramuscular Given 01/23/20 1150)    PERTINENT CLINICAL COURSE NOTES/UPDATES:   Initial Impression / Assessment and Plan / Urgent Care Course:  Pertinent labs & imaging results that were available during my care of the patient were personally reviewed by me and considered in my medical decision making (see lab/imaging section of note for values and interpretations).  Roger Ponce is a 29 y.o. male who presents to [facility] today with complaints of Exposure to STD  Patient is well appearing overall in clinic today. He does not appear to be in any acute distress. Presenting symptoms (see HPI) and exam as documented above. Symptoms following (+) known exposure to sexual partner who has confirmed chlamydia and gonorrhea. Sexual encounter was protected, however prophylactic failed. STI testing discussed with patient. He was advised that testing would be performed urine and would result later today. Patient asked to abstain for sexual activity until all testing has result as negative.  He was advised that he would be contacted with any POSITIVE results only and that all results could be view on myChart. Unless there is a change in the plan of care that deviates from what we discussed today in clinic, patient will not be contacted (negative results). In the event that there any positive results, patient encouraged to notify all of his sexual partners so that they may have the chance to get tested and be treated to prevent further transmission to others. He verbalizes understanding and wishes to proceed.   UA today negative for infection; 0-5 WBC/hpf, 0-5 RBC/hpf, few bacteria; no nitrites or LE.  Random urine for GC self collected by patient and sent for testing.  Given his symptoms, patient wishes to proceed with prophylactic treatment today rather than waiting on testing results. Discussed new CDC treatment guidelines that have increased the ceftriaxone dose for gonorrhea and changed to a 7 day course of oral ABX for chlamydia. Patient adamant about proceeding with treatment for both today; does not wait. Ceftriaxone 500 mg IM given  in clinic. Prescription for 7 day oral doxycycline course. He was advised that he should follow up with PCP or the health department should his symptoms do not resolve.   Discussed follow up with primary care physician in 1 week for re-evaluation. I have reviewed the follow up and strict return precautions for any new or worsening symptoms. Patient is aware of symptoms that would be deemed urgent/emergent, and would thus require further evaluation either here or in the emergency department. At the time of discharge, he verbalized understanding and consent with the discharge plan as it was reviewed with him. All questions were fielded by provider and/or clinic staff prior to patient discharge.    Final Clinical Impressions / Urgent Care Diagnoses:   Final diagnoses:  Exposure to STD  Concern about STD in male without diagnosis  Screen for STD (sexually  transmitted disease)    New Prescriptions:  [place] Controlled Substance Registry consulted? Not Applicable  Meds ordered this encounter  Medications  . cefTRIAXone (ROCEPHIN) injection 500 mg  . doxycycline (VIBRAMYCIN) 100 MG capsule    Sig: Take 1 capsule (100 mg total) by mouth 2 (two) times daily for 7 days.    Dispense:  14 capsule    Refill:  0    Recommended Follow up Care:  Patient encouraged to follow up with the following provider within the specified time frame, or sooner as dictated by the severity of his symptoms. As always, he was instructed that for any urgent/emergent care needs, he should seek care either here or in the emergency department for more immediate evaluation.  Follow-up Information    PCP In 1 week.   Why: General reassessment of symptoms if not improving        NOTE: This note was prepared using Scientist, clinical (histocompatibility and immunogenetics) along with smaller Lobbyist. Despite my best ability to proofread, there is the potential that transcriptional errors may still occur from this process, and are completely unintentional.    Verlee Monte, NP 01/23/20 1306

## 2020-01-23 NOTE — ED Triage Notes (Signed)
Patient states that his girlfriend tested positive for both Chlamydia and GC.  Patient states he is having some burning when urinating and penile discharge.

## 2020-01-23 NOTE — Discharge Instructions (Addendum)
It was very nice seeing you today in clinic. Thank you for entrusting me with your care.   Treated in clinic for both gonorrhea and chlamydia. No further treatment is required. Testing will be back late today or early tomorrow. If (+) you will still get a call. If the chlamydia is negative, we will call you and let you know to stop the antibiotics.   Make arrangements to follow up with your regular doctor in 1 week for re-evaluation if not improving. If your symptoms/condition worsens, please seek follow up care either here or in the ER. Please remember, our [facility] providers are "right here with you" when you need [place].   Again, it was my pleasure to take care of you today. Thank you for choosing our clinic. I hope that you start to feel better quickly.   Quentin Mulling, MSN, APRN, FNP-C, CEN Advanced Practice Provider [facility]

## 2021-05-05 IMAGING — CT CT T SPINE W/O CM
3 series · 10 of 33 positions shown, 11 images · non-contrast
Comparison: None.

CLINICAL DATA: History of T1 fracture.  Recent re-injury.

EXAM:
CT THORACIC SPINE WITHOUT CONTRAST
TECHNIQUE: Multidetector CT images of the thoracic were obtained using the
standard protocol without intravenous contrast.

[Series 6: t spine soft · axial · 0.32mm/px · z∈[-412,-238]mm · 2 of 189 slices shown, 3 images]
[im 58/189  soft-tissue]
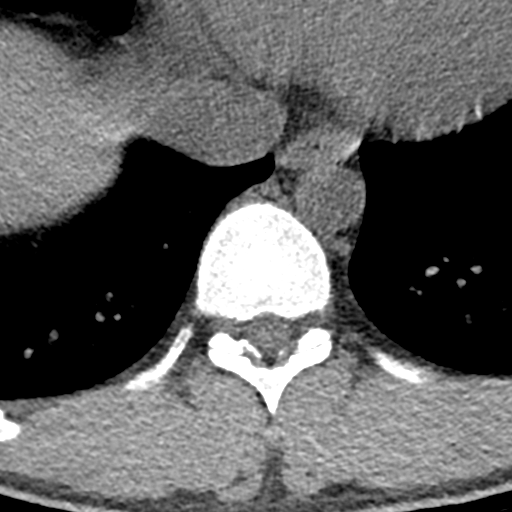
[im 58/189  bone]
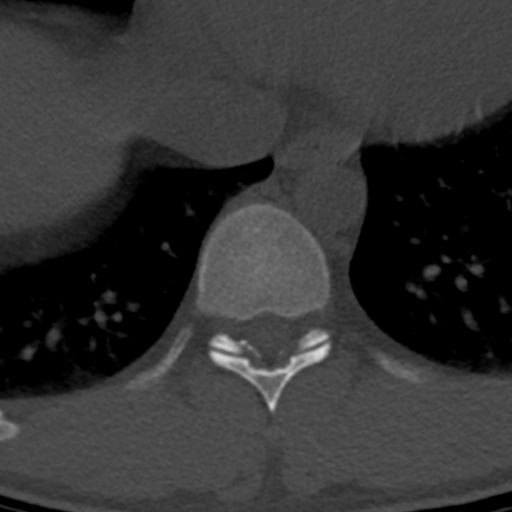
[im 145/189  bone]
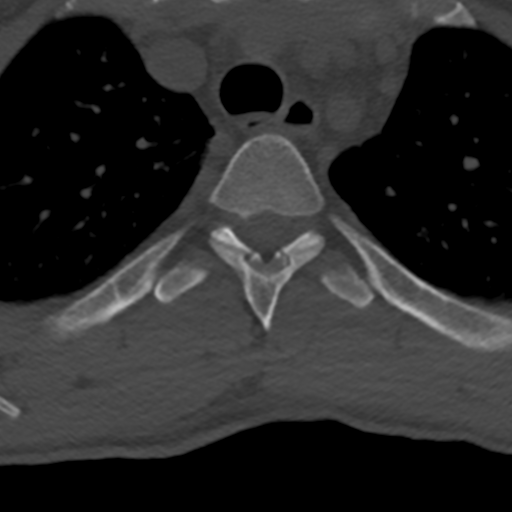

[Series 7: sagittal bone · sagittal · 0.33mm/px · 5 of 94 slices shown]
[im 32/94  bone]
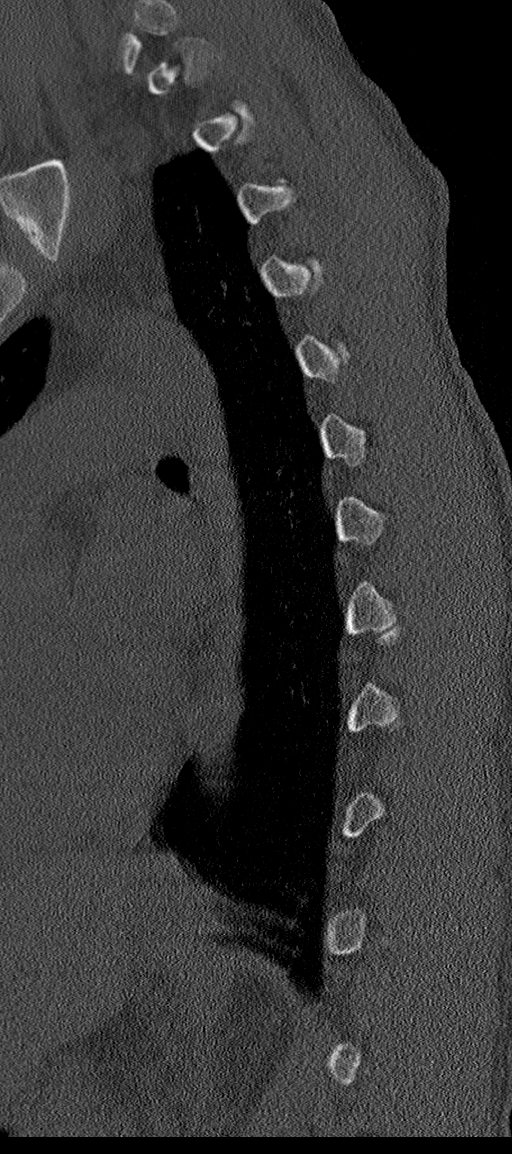
[im 39/94  bone]
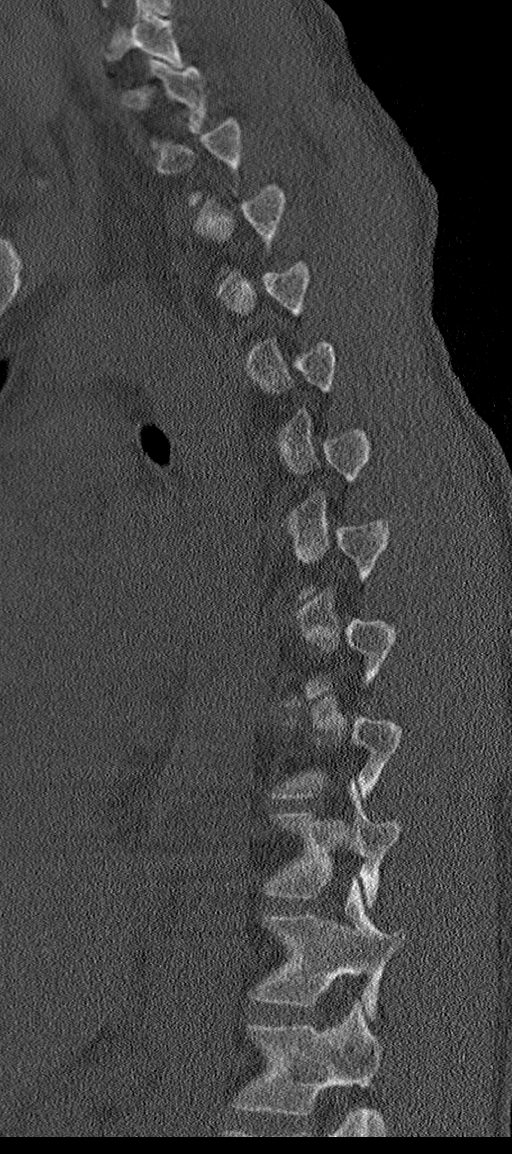
[im 47/94  bone]
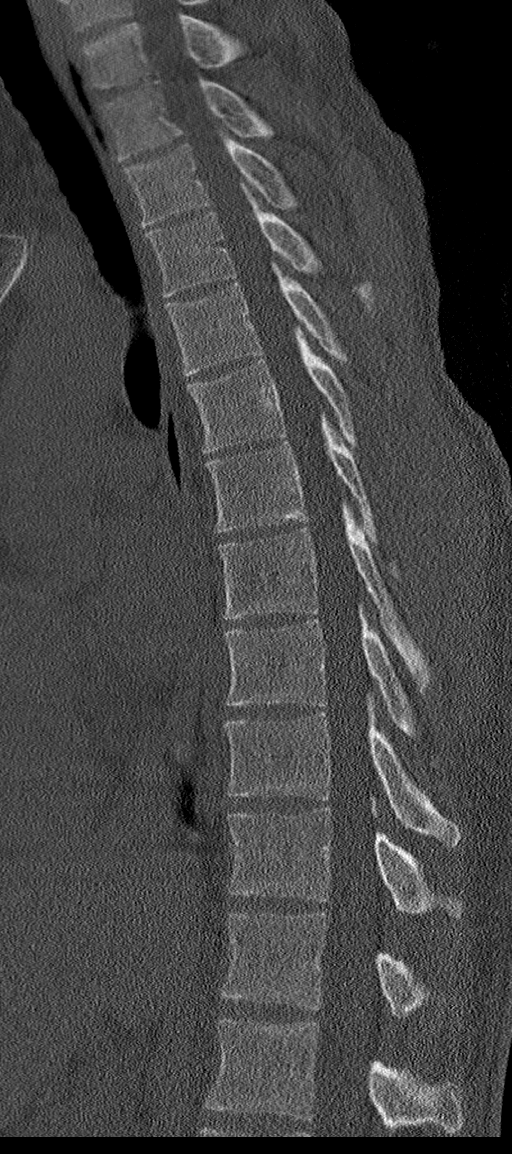
[im 55/94  bone]
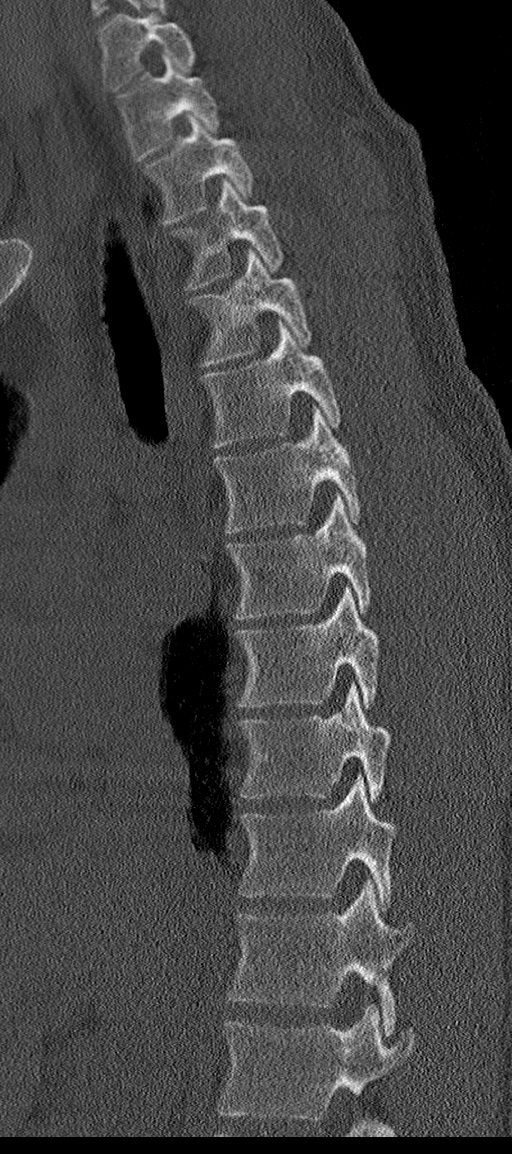
[im 63/94  bone]
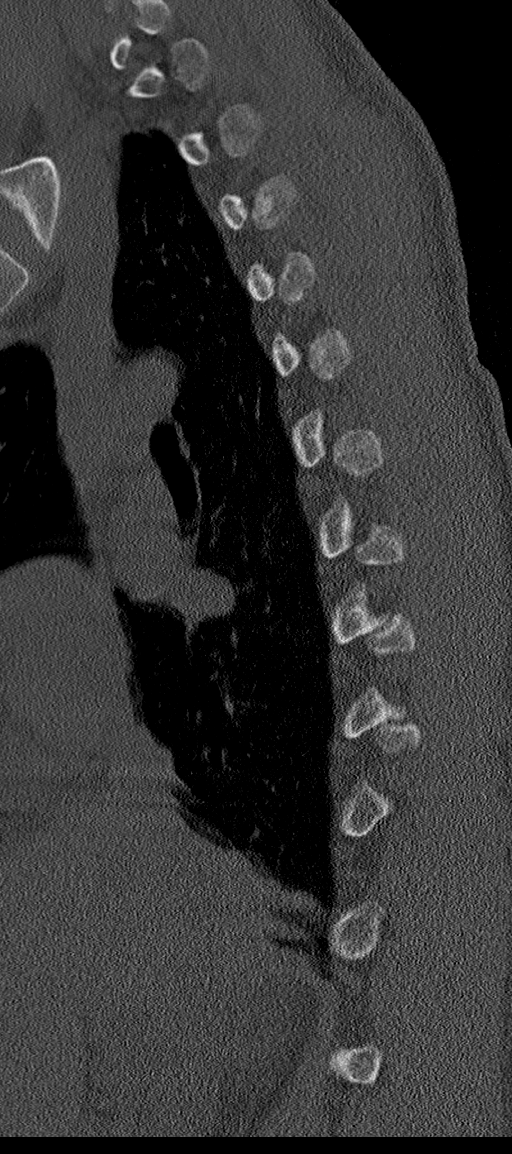

[Series 8: coronal bone · coronal · 0.35mm/px · 3 of 76 slices shown]
[im 16/76  bone]
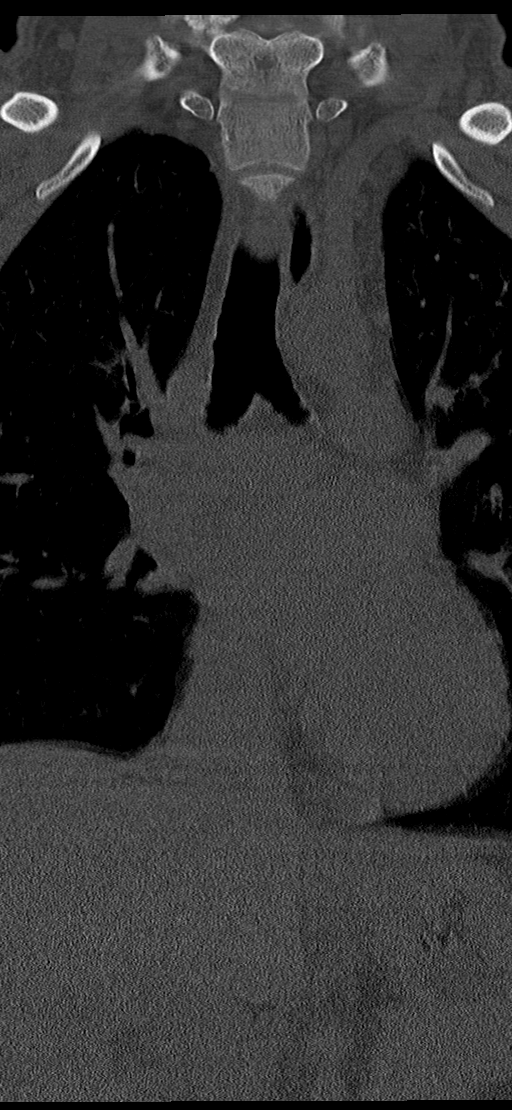
[im 31/76  bone]
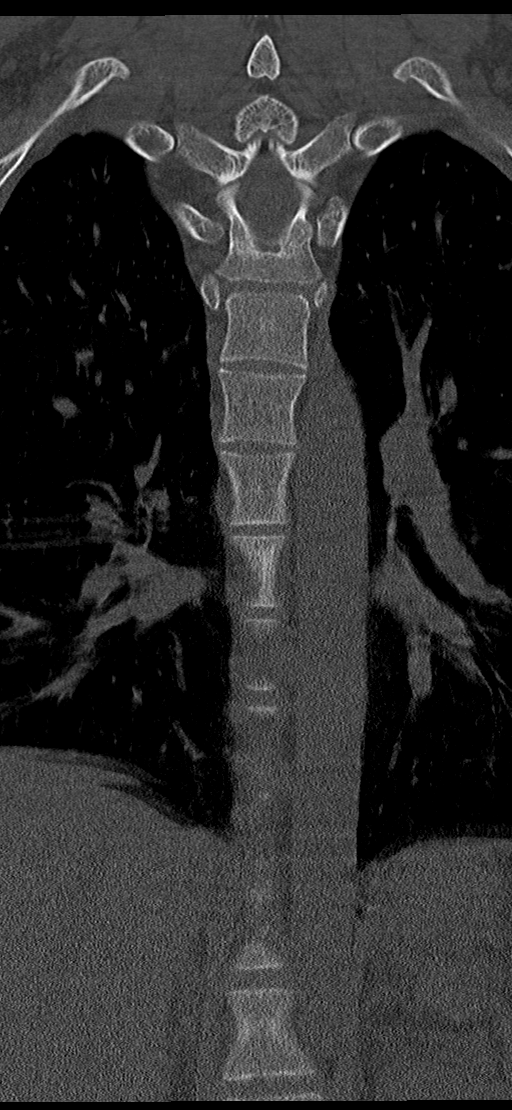
[im 46/76  bone]
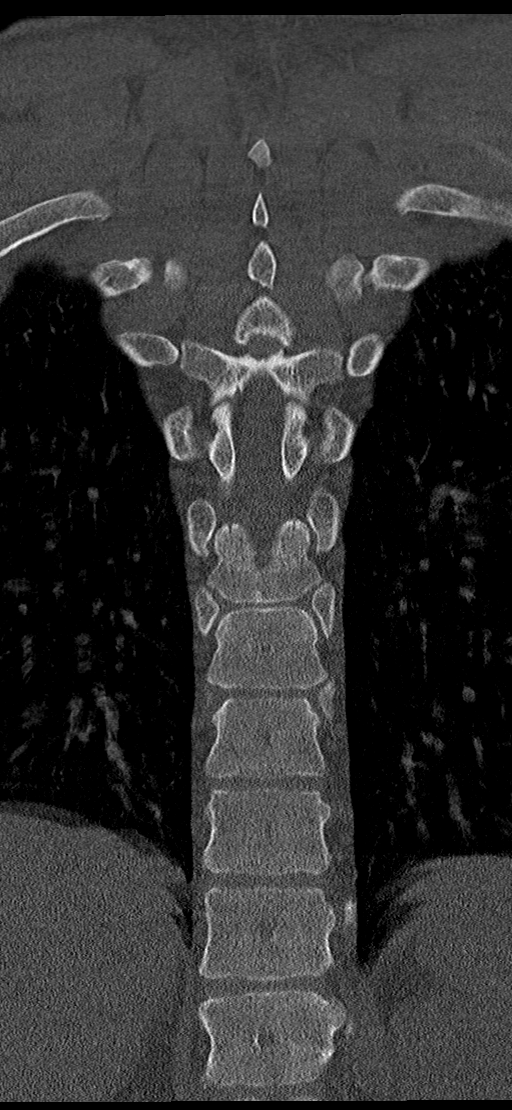

[10 of 33 positions shown; findings below may reference images not displayed]

FINDINGS: Alignment: Normal

Vertebrae: No acute thoracic spine fracture is identified. No bone
lesions.

Paraspinal and other soft tissues: No significant paraspinal
findings are identified. No mediastinal mass, adenopathy or
hematoma. No paraspinal hematoma. The visualized lungs are grossly
clear. The visualized posterior ribs are intact.

Disc levels: No disc protrusions, spinal or foraminal stenosis.
IMPRESSION: Normal alignment and no acute bony findings.

## 2022-12-12 ENCOUNTER — Encounter: Payer: Self-pay | Admitting: Podiatry

## 2022-12-12 ENCOUNTER — Ambulatory Visit: Payer: No Typology Code available for payment source | Admitting: Podiatry

## 2022-12-12 ENCOUNTER — Ambulatory Visit (INDEPENDENT_AMBULATORY_CARE_PROVIDER_SITE_OTHER): Payer: Self-pay

## 2022-12-12 VITALS — BP 131/62 | HR 82

## 2022-12-12 DIAGNOSIS — S99921A Unspecified injury of right foot, initial encounter: Secondary | ICD-10-CM

## 2022-12-13 NOTE — Progress Notes (Signed)
   Chief Complaint  Patient presents with   Foot Injury    Patient reports dropping a bar on his foot at work. Was send at urgent care and given a post-op shoe and told to follow-up with podiatry.     HPI: 32 y.o. male presenting today as a new patient for evaluation of an injury that occurred at work.  Patient states that he dropped a metal bar on his great toe.  He was sent to the urgent care and given a postoperative shoe.  He presents today for follow-up treatment and evaluation.  Past Medical History:  Diagnosis Date   Depression     Past Surgical History:  Procedure Laterality Date   ANKLE RECONSTRUCTION     TONSILLECTOMY      Allergies  Allergen Reactions   Morphine And Related Anaphylaxis   Amoxicillin Other (See Comments)    Unknown reaction   Demerol [Meperidine] Other (See Comments)    "I have to ask my mom, I don't remember"   Penicillins Other (See Comments)    Unknown reaction   Sulfa Antibiotics Other (See Comments)    Unknown      Physical Exam: General: The patient is alert and oriented x3 in no acute distress.  Dermatology: Obvious injury noted to the right hallux nail plate.  The nail plate is well adhered to the distal portion of the along the base of the nail plate it appears to have some slight drainage with subungual hematoma.  This is only to the proximal third of the nail plate.  Overall it appears somewhat stable and intact.  Clinically no indication of infection.  Vascular: Palpable pedal pulses bilaterally. Capillary refill within normal limits.  No appreciable edema or erythema noted.  Neurological: Light touch and protective threshold grossly intact  Musculoskeletal Exam: No pedal deformities noted.  Gross alignment of the toes noted  Radiographic Exam RT foot 12/13/2022:  Normal osseous mineralization.  Fracture noted to the distal phalanx of the right great toe, closed, nondisplaced  Assessment: 1.  Fracture distal phalanx right great toe  with obvious injury to the proximal portion of the nail plate   Plan of Care:  1. Patient evaluated. X-Rays reviewed.  2.  For now the nail plate appears somewhat stable and adhered.  We will simply observe for now. 3.  Continue WBAT postop shoe 4.  Continue oral antibiotics until completed as prescribed at the urgent care 5.  Return to clinic 4 weeks follow-up      Edrick Kins, DPM [facility]  Dr. Edrick Kins, DPM    [address], [place] [address]                Office [phone]  Fax [phone]

## 2022-12-19 ENCOUNTER — Telehealth: Payer: Self-pay | Admitting: Podiatry

## 2022-12-19 NOTE — Telephone Encounter (Signed)
Nurse Case Manager Pasty Spillers Fax [phone] Email,  [email or web address] [employer] patient

## 2023-01-03 ENCOUNTER — Ambulatory Visit: Payer: No Typology Code available for payment source | Admitting: Podiatry
